# Patient Record
Sex: Female | Born: 1970 | Race: White | Hispanic: No | Marital: Single | Smoking: Never smoker
Health system: Southern US, Community
[De-identification: ages and names within clinical notes are randomized; demographics above are authoritative.]

## PROBLEM LIST (undated history)

## (undated) DIAGNOSIS — F329 Major depressive disorder, single episode, unspecified: Secondary | ICD-10-CM

## (undated) DIAGNOSIS — C259 Malignant neoplasm of pancreas, unspecified: Secondary | ICD-10-CM

## (undated) DIAGNOSIS — A6000 Herpesviral infection of urogenital system, unspecified: Secondary | ICD-10-CM

## (undated) DIAGNOSIS — L811 Chloasma: Secondary | ICD-10-CM

## (undated) DIAGNOSIS — F909 Attention-deficit hyperactivity disorder, unspecified type: Secondary | ICD-10-CM

## (undated) DIAGNOSIS — K219 Gastro-esophageal reflux disease without esophagitis: Secondary | ICD-10-CM

## (undated) DIAGNOSIS — F419 Anxiety disorder, unspecified: Secondary | ICD-10-CM

## (undated) DIAGNOSIS — F32A Depression, unspecified: Secondary | ICD-10-CM

## (undated) DIAGNOSIS — G4700 Insomnia, unspecified: Secondary | ICD-10-CM

## (undated) HISTORY — DX: Major depressive disorder, single episode, unspecified: F32.9

## (undated) HISTORY — DX: Insomnia, unspecified: G47.00

## (undated) HISTORY — DX: Attention-deficit hyperactivity disorder, unspecified type: F90.9

## (undated) HISTORY — DX: Herpesviral infection of urogenital system, unspecified: A60.00

## (undated) HISTORY — DX: Depression, unspecified: F32.A

## (undated) HISTORY — DX: Malignant neoplasm of pancreas, unspecified: C25.9

## (undated) HISTORY — DX: Gastro-esophageal reflux disease without esophagitis: K21.9

## (undated) HISTORY — DX: Anxiety disorder, unspecified: F41.9

## (undated) HISTORY — DX: Chloasma: L81.1

---

## 1996-08-30 HISTORY — PX: TONSILLECTOMY: SUR1361

## 1999-08-28 ENCOUNTER — Ambulatory Visit (HOSPITAL_COMMUNITY): Admission: AD | Admit: 1999-08-28 | Discharge: 1999-08-28 | Payer: Self-pay | Admitting: Obstetrics and Gynecology

## 1999-11-16 ENCOUNTER — Inpatient Hospital Stay (HOSPITAL_COMMUNITY): Admission: AD | Admit: 1999-11-16 | Discharge: 1999-11-19 | Payer: Self-pay | Admitting: Obstetrics and Gynecology

## 2000-03-28 ENCOUNTER — Other Ambulatory Visit: Admission: RE | Admit: 2000-03-28 | Discharge: 2000-03-28 | Payer: Self-pay | Admitting: Obstetrics and Gynecology

## 2001-01-09 ENCOUNTER — Ambulatory Visit (HOSPITAL_COMMUNITY): Admission: RE | Admit: 2001-01-09 | Discharge: 2001-01-09 | Payer: Self-pay | Admitting: Obstetrics and Gynecology

## 2001-03-15 ENCOUNTER — Inpatient Hospital Stay (HOSPITAL_COMMUNITY): Admission: AD | Admit: 2001-03-15 | Discharge: 2001-03-18 | Payer: Self-pay | Admitting: Obstetrics and Gynecology

## 2001-04-13 ENCOUNTER — Other Ambulatory Visit: Admission: RE | Admit: 2001-04-13 | Discharge: 2001-04-13 | Payer: Self-pay | Admitting: Obstetrics and Gynecology

## 2001-10-02 ENCOUNTER — Other Ambulatory Visit: Admission: RE | Admit: 2001-10-02 | Discharge: 2001-10-02 | Payer: Self-pay | Admitting: Obstetrics and Gynecology

## 2003-01-30 ENCOUNTER — Other Ambulatory Visit: Admission: RE | Admit: 2003-01-30 | Discharge: 2003-01-30 | Payer: Self-pay | Admitting: Obstetrics and Gynecology

## 2004-04-02 ENCOUNTER — Other Ambulatory Visit: Admission: RE | Admit: 2004-04-02 | Discharge: 2004-04-02 | Payer: Self-pay | Admitting: Obstetrics and Gynecology

## 2005-10-07 ENCOUNTER — Other Ambulatory Visit: Admission: RE | Admit: 2005-10-07 | Discharge: 2005-10-07 | Payer: Self-pay | Admitting: Obstetrics and Gynecology

## 2011-12-23 ENCOUNTER — Ambulatory Visit (INDEPENDENT_AMBULATORY_CARE_PROVIDER_SITE_OTHER): Payer: BC Managed Care – PPO | Admitting: Physician Assistant

## 2011-12-23 ENCOUNTER — Encounter: Payer: Self-pay | Admitting: Physician Assistant

## 2011-12-23 VITALS — BP 108/63 | HR 86 | Temp 98.3°F | Resp 16 | Ht 61.0 in | Wt 124.0 lb

## 2011-12-23 DIAGNOSIS — F411 Generalized anxiety disorder: Secondary | ICD-10-CM

## 2011-12-23 DIAGNOSIS — G4700 Insomnia, unspecified: Secondary | ICD-10-CM

## 2011-12-23 DIAGNOSIS — K219 Gastro-esophageal reflux disease without esophagitis: Secondary | ICD-10-CM

## 2011-12-23 DIAGNOSIS — F419 Anxiety disorder, unspecified: Secondary | ICD-10-CM

## 2011-12-23 DIAGNOSIS — Z0000 Encounter for general adult medical examination without abnormal findings: Secondary | ICD-10-CM

## 2011-12-23 LAB — POCT URINALYSIS DIPSTICK
Ketones, UA: 15
Leukocytes, UA: NEGATIVE
Nitrite, UA: NEGATIVE
Protein, UA: NEGATIVE
Urobilinogen, UA: 0.2

## 2011-12-23 LAB — CBC WITH DIFFERENTIAL/PLATELET
Basophils Absolute: 0 10*3/uL (ref 0.0–0.1)
Basophils Relative: 0 % (ref 0–1)
Eosinophils Relative: 1 % (ref 0–5)
HCT: 41.3 % (ref 36.0–46.0)
MCHC: 34.9 g/dL (ref 30.0–36.0)
MCV: 88.8 fL (ref 78.0–100.0)
Monocytes Absolute: 0.3 10*3/uL (ref 0.1–1.0)
Monocytes Relative: 4 % (ref 3–12)
RDW: 11.8 % (ref 11.5–15.5)

## 2011-12-23 LAB — POCT UA - MICROSCOPIC ONLY
Crystals, Ur, HPF, POC: NEGATIVE
RBC, urine, microscopic: NEGATIVE

## 2011-12-23 MED ORDER — ESZOPICLONE 2 MG PO TABS: 2.0000 mg | ORAL_TABLET | Freq: Every day | ORAL | Status: DC

## 2011-12-23 MED ORDER — SERTRALINE HCL 50 MG PO TABS: 50.0000 mg | ORAL_TABLET | Freq: Every day | ORAL | Status: DC

## 2011-12-23 MED ORDER — OMEPRAZOLE 40 MG PO CPDR: 40.0000 mg | DELAYED_RELEASE_CAPSULE | Freq: Every day | ORAL | Status: DC

## 2011-12-23 NOTE — Progress Notes (Signed)
Called in Rx for [facility]esta to [facility] that printed off at [facility].
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
## 2011-12-23 NOTE — Patient Instructions (Signed)
Please take your sertraline (zoloft) take 1/2 tablet daily for 1 week then increase to 1 tablet daily. We will call you with the results of your labs.  Anxiety and Panic Attacks Your caregiver has informed you that you are having an anxiety or panic attack. There may be many forms of this. Most of the time these attacks come suddenly and without warning. They come at any time of day, including periods of sleep, and at any time of life. They may be strong and unexplained. Although panic attacks are very scary, they are physically harmless. Sometimes the cause of your anxiety is not known. Anxiety is a protective mechanism of the body in its fight or flight mechanism. Most of these perceived danger situations are actually nonphysical situations (such as anxiety over losing a job). CAUSES  The causes of an anxiety or panic attack are many. Panic attacks may occur in otherwise healthy people given a certain set of circumstances. There may be a genetic cause for panic attacks. Some medications may also have anxiety as a side effect. SYMPTOMS  Some of the most common feelings are:  Intense terror.   Dizziness, feeling faint.   Hot and cold flashes.   Fear of going crazy.   Feelings that nothing is real.   Sweating.   Shaking.   Chest pain or a fast heartbeat (palpitations).   Smothering, choking sensations.   Feelings of impending doom and that death is near.   Tingling of extremities, this may be from over-breathing.   Altered reality (derealization).   Being detached from yourself (depersonalization).  Several symptoms can be present to make up anxiety or panic attacks. DIAGNOSIS  The evaluation by your caregiver will depend on the type of symptoms you are experiencing. The diagnosis of anxiety or panic attack is made when no physical illness can be determined to be a cause of the symptoms. TREATMENT  Treatment to prevent anxiety and panic attacks may include:  Avoidance of  circumstances that cause anxiety.   Reassurance and relaxation.   Regular exercise.   Relaxation therapies, such as yoga.   Psychotherapy with a psychiatrist or therapist.   Avoidance of caffeine, alcohol and illegal drugs.   Prescribed medication.  SEEK IMMEDIATE MEDICAL CARE IF:   You experience panic attack symptoms that are different than your usual symptoms.   You have any worsening or concerning symptoms.  Document Released: 08/16/2005 Document Revised: 08/05/2011 Document Reviewed: 12/18/2009 [facility] Patient Information 2012 [place], [place]. Insomnia Insomnia is frequent trouble falling and/or staying asleep. Insomnia can be a long term problem or a short term problem. Both are common. Insomnia can be a short term problem when the wakefulness is related to a certain stress or worry. Long term insomnia is often related to ongoing stress during waking hours and/or poor sleeping habits. Overtime, sleep deprivation itself can make the problem worse. Every little thing feels more severe because you are overtired and your ability to cope is decreased. CAUSES   Stress, anxiety, and depression.   Poor sleeping habits.   Distractions such as TV in the bedroom.   Naps close to bedtime.   Engaging in emotionally charged conversations before bed.   Technical reading before sleep.   Alcohol and other sedatives. They may make the problem worse. They can hurt normal sleep patterns and normal dream activity.   Stimulants such as caffeine for several hours prior to bedtime.   Pain syndromes and shortness of breath can cause insomnia.   Exercise  late at night.   Changing time zones may cause sleeping problems (jet lag).  It is sometimes helpful to have someone observe your sleeping patterns. They should look for periods of not breathing during the night (sleep apnea). They should also look to see how long those periods last. If you live alone or observers are uncertain, you can  also be observed at a sleep clinic where your sleep patterns will be professionally monitored. Sleep apnea requires a checkup and treatment. Give your caregivers your medical history. Give your caregivers observations your family has made about your sleep.  SYMPTOMS   Not feeling rested in the morning.   Anxiety and restlessness at bedtime.   Difficulty falling and staying asleep.  TREATMENT   Your caregiver may prescribe treatment for an underlying medical disorders. Your caregiver can give advice or help if you are using alcohol or other drugs for self-medication. Treatment of underlying problems will usually eliminate insomnia problems.   Medications can be prescribed for short time use. They are generally not recommended for lengthy use.   Over-the-counter sleep medicines are not recommended for lengthy use. They can be habit forming.   You can promote easier sleeping by making lifestyle changes such as:   Using relaxation techniques that help with breathing and reduce muscle tension.   Exercising earlier in the day.   Changing your diet and the time of your last meal. No night time snacks.   Establish a regular time to go to bed.   Counseling can help with stressful problems and worry.   Soothing music and white noise may be helpful if there are background noises you cannot remove.   Stop tedious detailed work at least one hour before bedtime.  HOME CARE INSTRUCTIONS   Keep a diary. Inform your caregiver about your progress. This includes any medication side effects. See your caregiver regularly. Take note of:   Times when you are asleep.   Times when you are awake during the night.   The quality of your sleep.   How you feel the next day.  This information will help your caregiver care for you.  Get out of bed if you are still awake after 15 minutes. Read or do some quiet activity. Keep the lights down. Wait until you feel sleepy and go back to bed.   Keep regular  sleeping and waking hours. Avoid naps.   Exercise regularly.   Avoid distractions at bedtime. Distractions include watching television or engaging in any intense or detailed activity like attempting to balance the household checkbook.   Develop a bedtime ritual. Keep a familiar routine of bathing, brushing your teeth, climbing into bed at the same time each night, listening to soothing music. Routines increase the success of falling to sleep faster.   Use relaxation techniques. This can be using breathing and muscle tension release routines. It can also include visualizing peaceful scenes. You can also help control troubling or intruding thoughts by keeping your mind occupied with boring or repetitive thoughts like the old concept of counting sheep. You can make it more creative like imagining planting one beautiful flower after another in your backyard garden.   During your day, work to eliminate stress. When this is not possible use some of the previous suggestions to help reduce the anxiety that accompanies stressful situations.  MAKE SURE YOU:   Understand these instructions.   Will watch your condition.   Will get help right away if you are not doing well  or get worse.  Document Released: 08/13/2000 Document Revised: 08/05/2011 Document Reviewed: 09/13/2007 [facility] Patient Information 2012 [place], [place].

## 2011-12-23 NOTE — Progress Notes (Signed)
I have examined this patient along with the student and agree.  

## 2011-12-24 LAB — H. PYLORI ANTIBODY, IGG: H Pylori IgG: 0.45 {ISR}

## 2011-12-24 LAB — LIPID PANEL
HDL: 60 mg/dL (ref >39)
LDL Cholesterol: 66 mg/dL (ref 0–99)
VLDL: 12 mg/dL (ref 0–40)

## 2011-12-24 LAB — COMPREHENSIVE METABOLIC PANEL
ALT: 19 U/L (ref 0–35)
AST: 22 U/L (ref 0–37)
Alkaline Phosphatase: 41 U/L (ref 39–117)
CO2: 28 mEq/L (ref 19–32)
Sodium: 138 mEq/L (ref 135–145)
Total Bilirubin: 0.4 mg/dL (ref 0.3–1.2)
Total Protein: 6.9 g/dL (ref 6.0–8.3)

## 2012-01-20 ENCOUNTER — Encounter: Payer: Self-pay | Admitting: Physician Assistant

## 2012-01-20 ENCOUNTER — Ambulatory Visit (INDEPENDENT_AMBULATORY_CARE_PROVIDER_SITE_OTHER): Payer: BC Managed Care – PPO | Admitting: Physician Assistant

## 2012-01-20 VITALS — BP 113/66 | HR 74 | Temp 98.1°F | Resp 16 | Ht 61.5 in | Wt 122.6 lb

## 2012-01-20 DIAGNOSIS — G4700 Insomnia, unspecified: Secondary | ICD-10-CM

## 2012-01-20 DIAGNOSIS — K2100 Gastro-esophageal reflux disease with esophagitis, without bleeding: Secondary | ICD-10-CM

## 2012-01-20 DIAGNOSIS — F411 Generalized anxiety disorder: Secondary | ICD-10-CM

## 2012-01-20 DIAGNOSIS — F419 Anxiety disorder, unspecified: Secondary | ICD-10-CM

## 2012-01-20 MED ORDER — SERTRALINE HCL 50 MG PO TABS: 100.0000 mg | ORAL_TABLET | Freq: Every day | ORAL | Status: DC

## 2012-01-20 MED ORDER — ESZOPICLONE 2 MG PO TABS: 2.0000 mg | ORAL_TABLET | Freq: Every day | ORAL | Status: DC

## 2012-01-20 NOTE — Progress Notes (Signed)
  Subjective:    Patient ID: Allison Miller, female    DOB: 01/18/1971, 41 y.o.   MRN: [id number]  HPI Patient presents for 1 month follow up on anxiety and insomnia. States she likes the Zoloft, but she still feels like she is having palpitations at night. She is currently taking her Zoloft and Lunesta together at night and believes that could be causing the palpitations. Also says that the Alfonso Patten is not working as well as it did before and she finds that she is not sleeping through the night and is having to use it more nights than she has before.   Socially, she is working on setting boundaries and trying to manage her stress this way. Still planning on going to counseling - wants to try family counseling to include her 2 sons.   Acid reflux is under control with prilosec 40 mg daily.     Review of Systems  All other systems reviewed and are negative.       Objective:   Physical Exam  Constitutional: She is oriented to person, place, and time. She appears well-developed and well-nourished.  HENT:  Head: Normocephalic and atraumatic.  Right Ear: External ear normal.  Left Ear: External ear normal.  Nose: Nose normal.  Mouth/Throat: Oropharynx is clear and moist. No oropharyngeal exudate.  Eyes: Conjunctivae are normal.  Neck: Neck supple.  Cardiovascular: Normal rate, regular rhythm and normal heart sounds.   Pulmonary/Chest: Effort normal and breath sounds normal.  Lymphadenopathy:    She has no cervical adenopathy.  Neurological: She is alert and oriented to person, place, and time.  Psychiatric: She has a normal mood and affect. Her behavior is normal. Judgment and thought content normal.          Assessment & Plan:   1. Anxiety  Will first try taking Zoloft in a.m and Lunesta at night to see if palpitations improve and sleeping returns to normal. If no improvement, will increase dose of Lunesta to 3 mg qhs. If still not at goal, increase Zoloft to 100 mg daily  sertraline (ZOLOFT) 50 MG tablet  2. Insomnia  eszopiclone (LUNESTA) 2 MG TABS  3. Reflux esophagitis - controlled Continue current treatment plan

## 2012-01-24 NOTE — Progress Notes (Signed)
Precepted with Ms. Marte, PA-C and agree.  

## 2012-02-29 ENCOUNTER — Other Ambulatory Visit: Payer: Self-pay | Admitting: Family Medicine

## 2012-02-29 DIAGNOSIS — G4700 Insomnia, unspecified: Secondary | ICD-10-CM

## 2012-02-29 MED ORDER — ESZOPICLONE 2 MG PO TABS: 2.0000 mg | ORAL_TABLET | Freq: Every day | ORAL | Status: DC

## 2012-03-01 ENCOUNTER — Telehealth: Payer: Self-pay

## 2012-03-01 NOTE — Telephone Encounter (Signed)
lmom to call back 

## 2012-03-01 NOTE — Telephone Encounter (Signed)
Pt had CB and LM on nurse VM. Tried to call her back and had to [facility]. Ask pt what current doses of Lunesta and Zoloft and what time of day she is taking each (see plan at end of [facility] notes from 01/20/12), and details of Sxs.

## 2012-03-01 NOTE — Telephone Encounter (Signed)
Pt states that since she has been on the lunesta and zoloft she has had little sleep and has only had bowl movements once very three days. Please advise. [phone]

## 2012-03-02 NOTE — Telephone Encounter (Signed)
Pt reports that she is still taking the 50 mg of Zoloft in the morning because she is already having so much trouble w/constipation she didn't want to make it any worse. The Alfonso Patten is not really helping her sleep. She tried increasing it to 3 mg which did help her get to sleep a little better than the 2 mg, but she still couldn't stay asleep. She has too much trouble cutting the tablets in half w/out crushing them, so she has only been taking the one tab and it just isn't helping. Pt started using Miralax about 4 days ago w/no relief until today when she doubled the dose and took it w/prune juice which helped her move her bowels, but not completely. When she increased her fiber/fresh fruits and vegs, she just gets so bloated and uncomfortable she can't stand it. She drinks a lot of water and gets exercise daily. Please advise.

## 2012-03-06 NOTE — Telephone Encounter (Signed)
1. Constipation:  She can increase the Miralax to 3 times daily if needed to keep her moving her bowels.  If she thinks it's directly related to the sertraline (Zoloft), we can d/c it and try another product (I know she has tried celexa, but has she tried Effexor?).  2. Insomnia:  Of course, the plan was to increase the sertraline dose, but that may not be an option.  If she hasn't tried Ambien CR, we could try that.  If so, what about clonazepam?  Or Trazodone?

## 2012-03-07 NOTE — Telephone Encounter (Signed)
LMOM to CB. 

## 2012-03-08 MED ORDER — ZOLPIDEM TARTRATE ER 6.25 MG PO TBCR: 6.2500 mg | EXTENDED_RELEASE_TABLET | Freq: Every evening | ORAL | Status: DC | PRN

## 2012-03-08 MED ORDER — VENLAFAXINE HCL ER 37.5 MG PO CP24: 37.5000 mg | ORAL_CAPSULE | Freq: Every day | ORAL | Status: DC

## 2012-03-08 NOTE — Telephone Encounter (Signed)
Called in Ambien [facility] to [facility] and explained new meds and f/up plan to pt. Pt agreed to try new meds and f/up as instr'd.

## 2012-03-08 NOTE — Telephone Encounter (Signed)
LMOM to CB. 

## 2012-03-08 NOTE — Telephone Encounter (Signed)
Let's try the Effexor [facility] [address] [facility]), starting at 37.6 mg and then increasing to 2 PO QD after 1-2 weeks if she's tolerating it.  We'll also try the Ambien CR 6.25 mg (printed, please fax or call in), 1-2 PO QHS prn.  Plan to re-evaluate in 4-6 weeks, sooner if this is ineffective.

## 2012-03-08 NOTE — Telephone Encounter (Signed)
Pt CB and stated that she actually stopped the Zoloft about 4 days ago because she was so miserable and she hasn't had many SEs from stopping abruptly - only today starting just a little dizzy. With the help of laxatives, she seems to have her constipation under better control, but does think the Zoloft was responsible. Pt does not want to be on a med that might cause the same or even worse SEs, but would be willing to try something else if Chelle suggests it. She has not tried Effexor, or anything other than Celexa. Pt states she thinks she may have tried a couple of samples of Ambien several years ago, and doesn't think it helped but not sure and would try again, or the clonazepam or trazodone which she has not tried.

## 2012-03-22 ENCOUNTER — Ambulatory Visit (INDEPENDENT_AMBULATORY_CARE_PROVIDER_SITE_OTHER): Payer: BC Managed Care – PPO | Admitting: Physician Assistant

## 2012-03-22 ENCOUNTER — Other Ambulatory Visit: Payer: Self-pay | Admitting: Physician Assistant

## 2012-03-22 ENCOUNTER — Telehealth: Payer: Self-pay

## 2012-03-22 VITALS — BP 114/72 | HR 71 | Temp 98.4°F | Resp 18 | Ht 62.0 in | Wt 128.0 lb

## 2012-03-22 DIAGNOSIS — F419 Anxiety disorder, unspecified: Secondary | ICD-10-CM

## 2012-03-22 DIAGNOSIS — F411 Generalized anxiety disorder: Secondary | ICD-10-CM

## 2012-03-22 DIAGNOSIS — R42 Dizziness and giddiness: Secondary | ICD-10-CM

## 2012-03-22 MED ORDER — ALPRAZOLAM 0.25 MG PO TABS: 0.2500 mg | ORAL_TABLET | Freq: Two times a day (BID) | ORAL | Status: AC | PRN

## 2012-03-22 MED ORDER — MECLIZINE HCL 32 MG PO TABS: 32.0000 mg | ORAL_TABLET | Freq: Three times a day (TID) | ORAL | Status: DC | PRN

## 2012-03-22 MED ORDER — MECLIZINE HCL 25 MG PO TABS: 25.0000 mg | ORAL_TABLET | Freq: Three times a day (TID) | ORAL | Status: AC | PRN

## 2012-03-22 NOTE — Telephone Encounter (Signed)
rx sent in for right dosage and pt notified

## 2012-03-22 NOTE — Progress Notes (Signed)
  Subjective:    Patient ID: Allison Miller, female    DOB: Jun 23, 1971, 41 y.o.   MRN: [id number]  HPI Patient presents for recheck of anxiety. About 10 days ago she switched from Zoloft to Effexor due to side effects.  Took Effexor for about 10 days but says the side effects were even worse with Effexor than Zoloft. Complains of constipation, fatigue, and "just not feeling herself."  Says that major stressors in her life (namely her ex-husband) has been less of a stress.  She has set boundaries and has a new friend who has been a strong person to lean on. She is interested in trying to come off of the medication.   She is here today because she d/c'ed the Effexor 4 days ago and has been experiencing vertigo, dizziness, and "swimmy feeling." Says she had similar symptoms after she came off of Celexa several years ago.  Has had 2 episodes of emesis. No vision changes, headache, SOB, or chest pain.      Review of Systems  All other systems reviewed and are negative.       Objective:   Physical Exam  Constitutional: She is oriented to person, place, and time. She appears well-developed and well-nourished.  HENT:  Head: Normocephalic and atraumatic.  Right Ear: External ear normal.  Left Ear: External ear normal.  Eyes: Conjunctivae are normal.  Neck: Normal range of motion.  Cardiovascular: Regular rhythm.   Pulmonary/Chest: Effort normal.  Musculoskeletal: Normal range of motion.  Neurological: She is alert and oriented to person, place, and time.  Psychiatric: She has a normal mood and affect. Her behavior is normal. Judgment and thought content normal.          Assessment & Plan:   1. Vertigo  Will treat with Meclizine tid as needed for vertigo. Follow up with any worsening symptoms, or if they fail to improve. meclizine (ANTIVERT) 32 MG tablet  2. Anxiety  Effexor discontinued.  Xanax 0.25 mg written to take as needed for anxiety. Trial of no daily medicine. Patient  instructed to call or follow up if she finds herself needing to use Xanax daily, or worsening symptoms.   ALPRAZolam (XANAX) 0.25 MG tablet

## 2012-03-22 NOTE — Telephone Encounter (Signed)
Pt states that she was in earlier and was prescribed something for vertigo but the pharmacy states that something is wrong with the dosage, pt would like [facility] [address] to correct this asap. [phone]

## 2012-03-31 ENCOUNTER — Encounter: Payer: Self-pay | Admitting: Physician Assistant

## 2012-04-27 ENCOUNTER — Ambulatory Visit: Payer: BC Managed Care – PPO | Admitting: Physician Assistant

## 2012-05-22 ENCOUNTER — Telehealth: Payer: Self-pay

## 2012-05-22 NOTE — Telephone Encounter (Signed)
Please get more details.

## 2012-05-22 NOTE — Telephone Encounter (Signed)
PT WOULD LIKE TO KNOW IF SHE SHOULD RESUME TAKING HER CELE XA . PLEASE CALL [phone]

## 2012-05-24 NOTE — Telephone Encounter (Signed)
She was seen in July and stopped Effexor due to nausea, now is asking if she can try Celexa, she took this for several years and states she did well. Please advise.

## 2012-05-24 NOTE — Telephone Encounter (Signed)
Allison Miller called again, the two previous anxiety medications gave her stomach issues.  When last seen, provider advised that if needed, she could try Celexa which had worked well for her in the past.  Myrella's previous dosage had been 40 mg daily.  [phone]

## 2012-05-25 MED ORDER — CITALOPRAM HYDROBROMIDE 20 MG PO TABS: 20.0000 mg | ORAL_TABLET | Freq: Every day | ORAL | Status: DC

## 2012-05-25 NOTE — Telephone Encounter (Signed)
Start Celexa 20 mg daily and then will need a recheck in 4-6 weeks. Rx sent to pharmacy

## 2012-05-26 NOTE — Telephone Encounter (Signed)
LMOM notifying pt of Rx and f/up instr's. Asked for CB w/any further ?s.

## 2012-06-28 ENCOUNTER — Other Ambulatory Visit: Payer: Self-pay | Admitting: Physician Assistant

## 2012-08-01 ENCOUNTER — Telehealth: Payer: Self-pay

## 2012-08-01 MED ORDER — CITALOPRAM HYDROBROMIDE 20 MG PO TABS: 20.0000 mg | ORAL_TABLET | Freq: Every day | ORAL | Status: DC

## 2012-08-01 NOTE — Telephone Encounter (Signed)
Pt has made an appt with Chelle for 12/19, needs one more month of Celexa please. [facility] [phone]

## 2012-08-01 NOTE — Telephone Encounter (Signed)
Sent to pharmacy 

## 2012-08-07 NOTE — Telephone Encounter (Signed)
Encounter still open, not sure if pt notified to p/u RX

## 2012-08-07 NOTE — Telephone Encounter (Signed)
Thanks, yes, patient has gotten it.

## 2012-08-17 ENCOUNTER — Encounter: Payer: Self-pay | Admitting: Physician Assistant

## 2012-08-17 ENCOUNTER — Ambulatory Visit (INDEPENDENT_AMBULATORY_CARE_PROVIDER_SITE_OTHER): Payer: BC Managed Care – PPO | Admitting: Physician Assistant

## 2012-08-17 VITALS — BP 116/68 | HR 76 | Temp 98.0°F | Resp 16 | Ht 62.0 in | Wt 122.0 lb

## 2012-08-17 DIAGNOSIS — F419 Anxiety disorder, unspecified: Secondary | ICD-10-CM | POA: Insufficient documentation

## 2012-08-17 DIAGNOSIS — G4700 Insomnia, unspecified: Secondary | ICD-10-CM | POA: Insufficient documentation

## 2012-08-17 DIAGNOSIS — K219 Gastro-esophageal reflux disease without esophagitis: Secondary | ICD-10-CM

## 2012-08-17 DIAGNOSIS — A6000 Herpesviral infection of urogenital system, unspecified: Secondary | ICD-10-CM

## 2012-08-17 DIAGNOSIS — F411 Generalized anxiety disorder: Secondary | ICD-10-CM

## 2012-08-17 MED ORDER — ALPRAZOLAM 0.25 MG PO TABS: 0.2500 mg | ORAL_TABLET | Freq: Every evening | ORAL | Status: DC | PRN

## 2012-08-17 MED ORDER — VALACYCLOVIR HCL 500 MG PO TABS: 500.0000 mg | ORAL_TABLET | Freq: Two times a day (BID) | ORAL | Status: DC

## 2012-08-17 MED ORDER — CITALOPRAM HYDROBROMIDE 40 MG PO TABS: 40.0000 mg | ORAL_TABLET | Freq: Every day | ORAL | Status: DC

## 2012-08-17 MED ORDER — OMEPRAZOLE 40 MG PO CPDR: 40.0000 mg | DELAYED_RELEASE_CAPSULE | Freq: Every day | ORAL | Status: DC

## 2012-08-17 NOTE — Progress Notes (Signed)
Subjective:    Patient ID: Allison Miller, female    DOB: 07-10-1971, 41 y.o.   MRN: [id number]  HPI This 41 y.o. female presents for evaluation of insomnia and anxiety. Is doing much better on citalopram 20 mg than on Effexor or Zoloft. "I should have gone back to celexa a long time ago."  Her ex-husband was taking her to court for increased custody of their sons, which was really escalating her symptoms.  They were in mediation, but have decided mutually that they would stop, as their boys are early teens and are suffering with the tension between their parents.  She feels much better with that perspective, but she's still not sleeping well.  Was previously on Celexa 40 mg. Wants to increase to that again. No thoughts of self or other harm.  She also requests refills of valtrex and omeprazole, which are typically filled by her GYN.  She has an appointment scheduled there, but she will run out prior to it.  Past Medical History  Diagnosis Date  . Anxiety   . Insomnia     No past surgical history on file.  Prior to Admission medications   Medication Sig Start Date End Date Taking? Authorizing Provider  ALPRAZolam (XANAX) 0.25 MG tablet Take 1 tablet (0.25 mg total) by mouth at bedtime as needed for sleep or anxiety. 08/17/12  Yes Damarko Stitely S [place] Vandergriff, PA-C  citalopram (CELEXA) 20 MG tablet Take 1 tablet (20 mg total) by mouth daily.   Yes Jethro Radke S Robert Sunga, PA-C  loratadine (CLARITIN) 10 MG tablet Take 10 mg by mouth daily.   Yes Historical Provider, MD  Multiple Vitamin (MULTIVITAMIN) tablet Take 1 tablet by mouth daily.   Yes Historical Provider, MD  omeprazole (PRILOSEC) 40 MG capsule Take 1 capsule (40 mg total) by mouth daily. 08/17/12 08/17/13 Yes Arleny Kruger S Glori Machnik, PA-C  phentermine 37.5 MG capsule Take 37.5 mg by mouth every morning.   Yes Historical Provider, MD  valACYclovir (VALTREX) 500 MG tablet Take 1 tablet (500 mg total) by mouth 2 (two) times daily. 08/17/12  Yes Delayla Hoffmaster S  Danisha Brassfield, PA-C  zolpidem (AMBIEN CR) 6.25 MG CR tablet Take 1-2 tablets (6.25-12.5 mg total) by mouth at bedtime as needed for sleep. 03/08/12 04/07/12  Braylynn Lewing S Selby Foisy, PA-C    No Known Allergies   No family history on file.  Review of Systems As above.    Objective:   Physical Exam  Constitutional: She is oriented to person, place, and time. Vital signs are normal. She appears well-developed and well-nourished. No distress.  HENT:  Head: Normocephalic and atraumatic.  Right Ear: Hearing normal.  Left Ear: Hearing normal.  Eyes: EOM are normal. Pupils are equal, round, and reactive to light.  Neck: Normal range of motion. Neck supple. No thyromegaly present.  Cardiovascular: Normal rate, regular rhythm and normal heart sounds.   Pulses:      Radial pulses are 2+ on the right side, and 2+ on the left side.  Pulmonary/Chest: Effort normal and breath sounds normal.  Lymphadenopathy:       Head (right side): No tonsillar, no preauricular, no posterior auricular and no occipital adenopathy present.       Head (left side): No tonsillar, no preauricular, no posterior auricular and no occipital adenopathy present.    She has no cervical adenopathy.       Right: No supraclavicular adenopathy present.       Left: No supraclavicular adenopathy present.  Neurological: She is alert  and oriented to person, place, and time. No sensory deficit.  Skin: Skin is warm, dry and intact. No rash noted. No cyanosis or erythema. Nails show no clubbing.  Psychiatric: She has a normal mood and affect.       Assessment & Plan:   1. Insomnia  ALPRAZolam (XANAX) 0.25 MG tablet  2. Anxiety  Increase citalopram (CELEXA) 40 MG tablet, ALPRAZolam (XANAX) 0.25 MG tablet  3. Genital HSV  valACYclovir (VALTREX) 500 MG tablet  4. Esophageal reflux  omeprazole (PRILOSEC) 40 MG capsule

## 2012-11-21 ENCOUNTER — Other Ambulatory Visit: Payer: Self-pay | Admitting: Radiology

## 2012-11-21 DIAGNOSIS — F419 Anxiety disorder, unspecified: Secondary | ICD-10-CM

## 2012-11-21 DIAGNOSIS — G4700 Insomnia, unspecified: Secondary | ICD-10-CM

## 2012-11-21 NOTE — Telephone Encounter (Signed)
Please advise on fax received from [facility] for patients Alprazolam. pended

## 2012-11-23 ENCOUNTER — Telehealth: Payer: Self-pay

## 2012-11-23 NOTE — Telephone Encounter (Signed)
Pharm requests RF of alprazolam 0.25 mg.

## 2012-11-24 MED ORDER — ALPRAZOLAM 0.25 MG PO TABS: 0.2500 mg | ORAL_TABLET | Freq: Every evening | ORAL | Status: DC | PRN

## 2012-11-24 NOTE — Telephone Encounter (Signed)
rx faxed

## 2012-11-24 NOTE — Telephone Encounter (Signed)
rx printed

## 2012-12-26 ENCOUNTER — Telehealth: Payer: Self-pay

## 2012-12-26 DIAGNOSIS — G4700 Insomnia, unspecified: Secondary | ICD-10-CM

## 2012-12-26 DIAGNOSIS — F419 Anxiety disorder, unspecified: Secondary | ICD-10-CM

## 2012-12-26 NOTE — Telephone Encounter (Signed)
Pharm requests RF of alprazolam 0.25 mg. 

## 2012-12-27 ENCOUNTER — Telehealth: Payer: Self-pay

## 2012-12-27 NOTE — Telephone Encounter (Signed)
Pt. Called needs refill on RX Xanax. Please let pt know when done.  [phone] [facility]

## 2012-12-28 MED ORDER — ALPRAZOLAM 0.25 MG PO TABS: 0.2500 mg | ORAL_TABLET | Freq: Every evening | ORAL | Status: DC | PRN

## 2012-12-28 NOTE — Telephone Encounter (Signed)
Called in for her

## 2012-12-28 NOTE — Telephone Encounter (Signed)
This has already been requested from pharmacy/ duplicate.

## 2012-12-28 NOTE — Telephone Encounter (Signed)
Please call in: Meds ordered this encounter  Medications  . ALPRAZolam (XANAX) 0.25 MG tablet    Sig: Take 1 tablet (0.25 mg total) by mouth at bedtime as needed for sleep or anxiety.    Dispense:  30 tablet    Refill:  0    Order Specific Question:  Supervising Provider    Answer:  DOOLITTLE, ROBERT P [[id number]]

## 2013-01-01 ENCOUNTER — Other Ambulatory Visit: Payer: Self-pay | Admitting: Radiology

## 2013-01-15 ENCOUNTER — Telehealth: Payer: Self-pay

## 2013-01-15 NOTE — Telephone Encounter (Signed)
Called her to find out what problems she is having with the Celexa. Left message for her to call me back.

## 2013-01-15 NOTE — Telephone Encounter (Signed)
Patient is wanting to know if she could change her Celexa 40mg  to Wellbutrin.  Please call patient back to discuss if we can change it or if she needs to come in to see Chelle.    Best#: [phone]

## 2013-01-15 NOTE — Telephone Encounter (Signed)
Spoke to patient she is c/o fatigue. She thinks the Celexa may be causing this. 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 Also she c/o lack of sexual desire with the Celexa.

## 2013-01-16 NOTE — Telephone Encounter (Signed)
Please advise the patient to RTC to discuss her fatigue and possible medication change.

## 2013-01-17 NOTE — Telephone Encounter (Signed)
Thank you . I have called her to advise, left message for her to call me back.

## 2013-01-17 NOTE — Telephone Encounter (Signed)
Pt advised.

## 2013-02-08 ENCOUNTER — Encounter: Payer: Self-pay | Admitting: Physician Assistant

## 2013-02-08 ENCOUNTER — Telehealth: Payer: Self-pay

## 2013-02-08 ENCOUNTER — Ambulatory Visit (INDEPENDENT_AMBULATORY_CARE_PROVIDER_SITE_OTHER): Payer: BC Managed Care – PPO | Admitting: Physician Assistant

## 2013-02-08 VITALS — BP 106/76 | HR 70 | Temp 99.1°F | Resp 16 | Ht 62.0 in | Wt 134.0 lb

## 2013-02-08 DIAGNOSIS — R0789 Other chest pain: Secondary | ICD-10-CM

## 2013-02-08 DIAGNOSIS — G4700 Insomnia, unspecified: Secondary | ICD-10-CM

## 2013-02-08 DIAGNOSIS — F411 Generalized anxiety disorder: Secondary | ICD-10-CM

## 2013-02-08 DIAGNOSIS — F419 Anxiety disorder, unspecified: Secondary | ICD-10-CM

## 2013-02-08 MED ORDER — BUPROPION HCL ER (XL) 150 MG PO TB24: 150.0000 mg | ORAL_TABLET | Freq: Every day | ORAL | Status: DC

## 2013-02-08 MED ORDER — ALPRAZOLAM 0.5 MG PO TABS: 0.5000 mg | ORAL_TABLET | Freq: Every evening | ORAL | Status: DC | PRN

## 2013-02-08 NOTE — Telephone Encounter (Signed)
PT SAW CHELLE TODAY AND SAID SHE WAS GOING TO CALL IN A PROBIOTIC VSL-3, BUT SHE FORGOT. SHE WOULD LIKE TO KNOW IF WE CAN GO AHEAD AND CALL THAT IN. [facility] [address] PT'S # [phone]

## 2013-02-08 NOTE — Telephone Encounter (Signed)
Please advise 

## 2013-02-08 NOTE — Telephone Encounter (Signed)
Please clarify with the patient that it's the "pack" that she wants, rather than the caps; and how often does she take it?

## 2013-02-08 NOTE — Progress Notes (Signed)
  Subjective:    Patient ID: Allison Miller, female    DOB: [birth year], 42 y.o.   MRN: [id number]  HPI This 42 y.o. female presents for evaluation of chronic insomnia and GERD as well as some new symptoms.  About 6 weeks ago started a health and wellness program, and wants me to review the ingredients of the products.  Has had weight gain instead of weight loss.  Sleeping really well, but tired "all day long." No sex drive at all.  Vaginal dryness. Alprazolam has helped, and takes once a week, if that.   24-day challenge-herbal cleanse for first 10 days-was constipated.  Then felt very good on the supplements x 4 weeks.  Then started having chest pains-heaviness, hard to breathe.  Stopped all the products about 10 days ago, and found the heaviness resolved, but still feels like she can't get a good deep breath.  Feels really tired.  Wants to restart the products, but is concerned the products caused the symptoms, or interacted with the Celexa.  A friend has switched from Celexa to Wellbutrin successfully, and is interested in that.  Review of Systems As above. No HA, dizziness, vision change, N/V, diarrhea, constipation, dysuria, urinary urgency or frequency, myalgias, arthralgias or rash.     Objective:   Physical Exam Blood pressure 106/76, pulse 70, temperature 99.1 F (37.3 C), temperature source Oral, resp. rate 16, height 5\' 2"  (1.575 m), weight 134 lb (60.782 kg), SpO2 97.00%. Body mass index is 24.5 kg/(m^2). Well-developed, well nourished WF who is awake, alert and oriented, in NAD. HEENT: [place]/AT, sclera and conjunctiva are clear.   Neck: supple, non-tender, no lymphadenopathy, thyromegaly. Heart: RRR, no murmur Lungs: normal effort, CTA Extremities: no cyanosis, clubbing or edema. Skin: warm and dry without rash. Psychologic: good mood and appropriate affect, normal speech and behavior.  The many supplements in the program are reviewed.  Multiple products contain stimulants  (caffeine, green coffee and guarana).      Assessment & Plan:  Insomnia - Plan: ALPRAZolam (XANAX) 0.5 MG tablet  Anxiety - Plan: buPROPion (WELLBUTRIN XL) 150 MG 24 hr tablet, ALPRAZolam (XANAX) 0.5 MG tablet  Chest pressure - likely secondary to increased stimulants and exacerbated anxiety.    Patient Instructions  Read the product labels on the supplements and use caution when taking several that contain caffeine, guarana, coffee, and other stimulants. Consider avoiding other caffeine-containing products on days that you take the supplements.  Follow-up at previously scheduled appointment in July.  Fernande Bras, PA-C Physician Assistant-Certified [facility]

## 2013-02-08 NOTE — Patient Instructions (Signed)
Read the product labels on the supplements and use caution when taking several that contain caffeine, guarana, coffee, and other stimulants. Consider avoiding other caffeine-containing products on days that you take the supplements.

## 2013-02-11 NOTE — Telephone Encounter (Signed)
Patient takes 1 capsule 2 times a day.

## 2013-02-12 MED ORDER — VSL#3 PO CAPS: 1.0000 | ORAL_CAPSULE | Freq: Two times a day (BID) | ORAL | Status: DC

## 2013-02-12 NOTE — Telephone Encounter (Signed)
Please advise the patient that the Probiotic VSL #3 [facility] [address]

## 2013-02-13 NOTE — Telephone Encounter (Signed)
Left message that rx was sent in to pharmacy, to call back if any questions.

## 2013-03-01 ENCOUNTER — Encounter: Payer: BC Managed Care – PPO | Admitting: Physician Assistant

## 2013-04-11 ENCOUNTER — Telehealth: Payer: Self-pay

## 2013-04-11 NOTE — Telephone Encounter (Signed)
Patient has questions about her Wellbutrin and Celexa. States that she is not seeing any improvement. [phone]

## 2013-04-11 NOTE — Telephone Encounter (Signed)
She should follow up , was due in July called her to advise and she was transferred to make an appt, she wants to know if you have any advise for her before the appt. She is c/o anxiety

## 2013-04-11 NOTE — Telephone Encounter (Signed)
She can increase the Wellbutrin XL 150 mg to 300 mg by taking 2 daily (take them together). She needs to follow-up with me in 4-6 weeks.

## 2013-04-12 ENCOUNTER — Ambulatory Visit: Payer: BC Managed Care – PPO | Admitting: Physician Assistant

## 2013-04-12 NOTE — Telephone Encounter (Signed)
Patient advised.

## 2013-04-12 NOTE — Telephone Encounter (Signed)
Called her to advise. Left message for her to call me back.  

## 2013-04-13 ENCOUNTER — Telehealth: Payer: Self-pay

## 2013-04-13 DIAGNOSIS — G4700 Insomnia, unspecified: Secondary | ICD-10-CM

## 2013-04-13 DIAGNOSIS — F419 Anxiety disorder, unspecified: Secondary | ICD-10-CM

## 2013-04-13 NOTE — Telephone Encounter (Signed)
Pharm reqs RF of alprazolam 0.5 mg tabs

## 2013-04-18 MED ORDER — ALPRAZOLAM 0.5 MG PO TABS: 0.5000 mg | ORAL_TABLET | Freq: Every evening | ORAL | Status: DC | PRN

## 2013-04-18 NOTE — Telephone Encounter (Signed)
Rx printed. Meds ordered this encounter  Medications  . ALPRAZolam (XANAX) 0.5 MG tablet    Sig: Take 1-2 tablets (0.5-1 mg total) by mouth at bedtime as needed for sleep or anxiety.    Dispense:  30 tablet    Refill:  0    Order Specific Question:  Supervising Provider    Answer:  DOOLITTLE, ROBERT P [[id number]]

## 2013-04-19 NOTE — Telephone Encounter (Signed)
Faxed

## 2013-05-10 ENCOUNTER — Ambulatory Visit (INDEPENDENT_AMBULATORY_CARE_PROVIDER_SITE_OTHER): Payer: BC Managed Care – PPO | Admitting: Physician Assistant

## 2013-05-10 ENCOUNTER — Encounter: Payer: Self-pay | Admitting: Physician Assistant

## 2013-05-10 VITALS — BP 108/62 | HR 64 | Temp 99.2°F | Resp 16 | Ht 62.25 in | Wt 130.6 lb

## 2013-05-10 DIAGNOSIS — G4700 Insomnia, unspecified: Secondary | ICD-10-CM

## 2013-05-10 DIAGNOSIS — F419 Anxiety disorder, unspecified: Secondary | ICD-10-CM

## 2013-05-10 DIAGNOSIS — F411 Generalized anxiety disorder: Secondary | ICD-10-CM

## 2013-05-10 NOTE — Progress Notes (Signed)
  Subjective:    Patient ID: Allison Miller, female    DOB: Mar 27, 1971, 42 y.o.   MRN: [id number]  HPI  This 42 y.o. female presents for evaluation of insomnia and anxiety. As decreased citalopram, had lots of emotional lability.  Increased back up to 40 mg. Increased the Wellbutrin XL to 300 mg, but didn't see any changes, so went back to 150 mg daily.  Doesn't feel any different than she did on the citalopram alone. Difficulty sleeping-alprazolam helps, but doesn't want to "need something to sleep."  She is using some relaxation techniques, has reduced caffeine, and is thinking that she'd like to just continue on the current meds for now and see how things go for the next few months.  Medications, allergies, past medical history, surgical history, family history, social history and problem list reviewed.   Review of Systems No chest pain, SOB, HA, dizziness, vision change, N/V, diarrhea, constipation, dysuria, urinary urgency or frequency, myalgias, arthralgias or rash.     Objective:   Physical Exam Blood pressure 108/62, pulse 64, temperature 99.2 F (37.3 C), temperature source Oral, resp. rate 16, height 5' 2.25" (1.581 m), weight 130 lb 9.6 oz (59.24 kg), SpO2 98.00%. Body mass index is 23.7 kg/(m^2). Well-developed, well nourished WF who is awake, alert and oriented, in NAD. HEENT: [place]/AT, sclera and conjunctiva are clear.   Neck: supple, non-tender, no lymphadenopathy, thyromegaly. Heart: RRR, no murmur Lungs: normal effort, CTA Extremities: no cyanosis, clubbing or edema. Skin: warm and dry without rash. Psychologic: good mood and appropriate affect, normal speech and behavior.        Assessment & Plan:  Insomnia  Anxiety   Continue citalopram 40 mg QD and Wellbutrin XL 150 mg QD for now.  Continue working on sleep hygiene and relaxation techniques.  Reassess in 3-6 months, sooner if her symptoms worsen, or she decides she'd like to try alternative treatment  options.  Fernande Bras, PA-C Physician Assistant-Certified [facility]

## 2013-05-10 NOTE — Patient Instructions (Signed)
Continue your current medications. Work on some relaxation techniques to help turn off your brain at night. If you don't have any improvements in the next 6-8 weeks, sooner if you need to.

## 2013-06-05 ENCOUNTER — Telehealth: Payer: Self-pay

## 2013-06-05 DIAGNOSIS — F419 Anxiety disorder, unspecified: Secondary | ICD-10-CM

## 2013-06-05 DIAGNOSIS — A6000 Herpesviral infection of urogenital system, unspecified: Secondary | ICD-10-CM

## 2013-06-05 MED ORDER — VALACYCLOVIR HCL 500 MG PO TABS: 500.0000 mg | ORAL_TABLET | Freq: Two times a day (BID) | ORAL | Status: DC

## 2013-06-05 MED ORDER — BUPROPION HCL ER (XL) 150 MG PO TB24: 150.0000 mg | ORAL_TABLET | Freq: Every day | ORAL | Status: DC

## 2013-06-05 NOTE — Telephone Encounter (Signed)
Spoke with pt, she needs Valtrex and Wellbutrin sent to [facility] [address]. Can we still do 3 month supply for Wellbutrin? Please advise

## 2013-06-05 NOTE — Telephone Encounter (Signed)
Pt needs prescriptions sent [facility] on [address] instead of [place] because of prices. [facility] her. Call at [phone].

## 2013-06-05 NOTE — Telephone Encounter (Signed)
LMOM to CB to let us know which RX's will be sent to [facility].

## 2013-06-05 NOTE — Telephone Encounter (Signed)
Sent to [facility] [address]!  °

## 2013-06-08 ENCOUNTER — Telehealth: Payer: Self-pay

## 2013-06-08 DIAGNOSIS — G4700 Insomnia, unspecified: Secondary | ICD-10-CM

## 2013-06-08 DIAGNOSIS — F419 Anxiety disorder, unspecified: Secondary | ICD-10-CM

## 2013-06-08 MED ORDER — ALPRAZOLAM 0.5 MG PO TABS: 0.5000 mg | ORAL_TABLET | Freq: Every evening | ORAL | Status: DC | PRN

## 2013-06-08 NOTE — Telephone Encounter (Signed)
Rx printed.  Meds ordered this encounter  Medications  . ALPRAZolam (XANAX) 0.5 MG tablet    Sig: Take 1-2 tablets (0.5-1 mg total) by mouth at bedtime as needed for sleep or anxiety.    Dispense:  30 tablet    Refill:  0    Order Specific Question:  Supervising Provider    Answer:  DOOLITTLE, ROBERT P [[id number]]

## 2013-06-08 NOTE — Telephone Encounter (Signed)
Please advise on request, have pended changed pharmacy.

## 2013-06-08 NOTE — Telephone Encounter (Signed)
Patient is calling to see if we can call [facility]anex into the [facility] [address] because [facility] does not have it call if questions to [phone]

## 2013-06-08 NOTE — Telephone Encounter (Signed)
RX called in to [facility] [address]. Left message to notify patient

## 2013-07-05 ENCOUNTER — Telehealth: Payer: Self-pay | Admitting: *Deleted

## 2013-07-05 ENCOUNTER — Other Ambulatory Visit: Payer: Self-pay | Admitting: Physician Assistant

## 2013-07-05 NOTE — Telephone Encounter (Signed)
Faxed [facility] [phone], per C Jeffery PA-C.

## 2013-08-01 ENCOUNTER — Other Ambulatory Visit: Payer: Self-pay | Admitting: Physician Assistant

## 2013-08-02 NOTE — Telephone Encounter (Signed)
Called patient to let her know to pick up Xanax prescription at the [address] walk in clinic before 8:30 pm. Also, she will have to signed for RX.

## 2013-08-02 NOTE — Telephone Encounter (Signed)
Printed:  Meds ordered this encounter  Medications  . ALPRAZolam (XANAX) 0.5 MG tablet    Sig: TAKE 1 TO 2 TABLETS BY MOUTH AT BEDTIME AS NEEDED FOR SLEEP    Dispense:  30 tablet    Refill:  0

## 2013-08-27 ENCOUNTER — Other Ambulatory Visit: Payer: Self-pay | Admitting: Internal Medicine

## 2013-08-30 NOTE — Telephone Encounter (Signed)
faxed

## 2013-09-13 ENCOUNTER — Other Ambulatory Visit: Payer: Self-pay | Admitting: Physician Assistant

## 2013-09-26 ENCOUNTER — Other Ambulatory Visit: Payer: Self-pay | Admitting: Physician Assistant

## 2013-10-04 ENCOUNTER — Ambulatory Visit: Payer: BC Managed Care – PPO | Admitting: Physician Assistant

## 2013-10-11 ENCOUNTER — Ambulatory Visit (INDEPENDENT_AMBULATORY_CARE_PROVIDER_SITE_OTHER): Payer: BC Managed Care – PPO | Admitting: Physician Assistant

## 2013-10-11 ENCOUNTER — Encounter: Payer: Self-pay | Admitting: Physician Assistant

## 2013-10-11 VITALS — BP 108/60 | HR 68 | Temp 98.1°F | Resp 16 | Ht 61.5 in | Wt 133.0 lb

## 2013-10-11 DIAGNOSIS — F419 Anxiety disorder, unspecified: Secondary | ICD-10-CM

## 2013-10-11 DIAGNOSIS — F411 Generalized anxiety disorder: Secondary | ICD-10-CM

## 2013-10-11 DIAGNOSIS — R41840 Attention and concentration deficit: Secondary | ICD-10-CM

## 2013-10-11 DIAGNOSIS — G4700 Insomnia, unspecified: Secondary | ICD-10-CM

## 2013-10-11 MED ORDER — ALPRAZOLAM 1 MG PO TABS: 0.5000 mg | ORAL_TABLET | Freq: Every evening | ORAL | Status: DC | PRN

## 2013-10-11 MED ORDER — AMPHETAMINE-DEXTROAMPHETAMINE 10 MG PO TABS: 5.0000 mg | ORAL_TABLET | Freq: Two times a day (BID) | ORAL | Status: DC

## 2013-10-11 NOTE — Progress Notes (Signed)
   Subjective:    Patient ID: Allison Miller, female    DOB: February 14, 1971, 43 y.o.   MRN: [id number]  HPI  Patient presents for follow up of anxiety. She would also like to discuss her recent lack of focus.   She has been taking citalopram 40 mg daily - she feels that this dose is working well for her. She is seeing a Social worker, Edrick Oh.   She is still taking xanax PRN for insomnia which she feels is well controlled - reports taking xanax every third night. occassionally she will take 2 tablets when "her mind is going crazy" but typically takes 1 tablet.   Reports that she is having difficulty focusing that has worsened over the last 2 months. She is getting very distracted by little things at work. If she starts something at home, she gets distracted and does not finish it and then forget about it. Would like to discuss using herbal supplements to help control focus. She has Tour manager (Vitmain C, Folic Acid and D35 combo), oregano, Ginseng, Kava Kava Root - she has not yet tried any of these supplements but would like to see if they could potentially help her to stay more focused.   Review of Systems As above.     Objective:   Physical Exam  Constitutional: She is oriented to person, place, and time. She appears well-developed and well-nourished.  HENT:  Head: Normocephalic and atraumatic.  Cardiovascular: Normal rate, regular rhythm and normal heart sounds.   Pulmonary/Chest: Effort normal and breath sounds normal.  Neurological: She is alert and oriented to person, place, and time.  Skin: Skin is warm and dry.  Psychiatric: She has a normal mood and affect. Her behavior is normal. Judgment and thought content normal.       Assessment & Plan:   1. Anxiety Well controlled. Continue seeing counselor. Continue medication as prescribed.  - ALPRAZolam (XANAX) 1 MG tablet; Take 0.5-1 tablets (0.5-1 mg total) by mouth at bedtime as needed for anxiety or sleep.   Dispense: 30 tablet; Refill: 0  2. Insomnia Well controlled. Continue medication as prescribed.  - ALPRAZolam (XANAX) 1 MG tablet; Take 0.5-1 tablets (0.5-1 mg total) by mouth at bedtime as needed for anxiety or sleep.  Dispense: 30 tablet; Refill: 0  3. Inattention Discussed use of herbal supplements with patient. Advised to begin one at a time to ensure side effects and/or results are identifiable by start date of supplement. Begin Adderall 10 mg BID for inattention. May begin with .5 tablet in the morning and take another .5 in the evening if needed. May increase dose according to symptom improvement. If feeling jittery or hyper, may reduce dose. Will re-evaluate in 1 month.  - amphetamine-dextroamphetamine (ADDERALL) 10 MG tablet; Take 0.5-1 tablets (5-10 mg total) by mouth 2 (two) times daily with a meal.  Dispense: 60 tablet; Refill: 0

## 2013-10-12 NOTE — Progress Notes (Signed)
I have examined this patient along with the student and agree.  

## 2013-10-25 ENCOUNTER — Other Ambulatory Visit: Payer: Self-pay | Admitting: Physician Assistant

## 2013-11-03 ENCOUNTER — Other Ambulatory Visit: Payer: Self-pay | Admitting: Physician Assistant

## 2013-11-05 NOTE — Telephone Encounter (Signed)
Chelle, you saw pt last month but not for GERD. Can we RF?

## 2013-11-13 ENCOUNTER — Other Ambulatory Visit: Payer: Self-pay | Admitting: Physician Assistant

## 2013-11-14 NOTE — Telephone Encounter (Signed)
Please phone this in: Meds ordered this encounter  Medications  . ALPRAZolam (XANAX) 1 MG tablet    Sig: TAKE 1/2 TO 1 TABLET AT BEDTIME AS NEEDED FOR SLEEP OR ANXIETY    Dispense:  30 tablet    Refill:  0

## 2013-11-14 NOTE — Telephone Encounter (Signed)
Called in.

## 2013-11-28 ENCOUNTER — Telehealth: Payer: Self-pay

## 2013-11-28 DIAGNOSIS — R41840 Attention and concentration deficit: Secondary | ICD-10-CM

## 2013-11-28 MED ORDER — AMPHETAMINE-DEXTROAMPHETAMINE 10 MG PO TABS: 5.0000 mg | ORAL_TABLET | Freq: Two times a day (BID) | ORAL | Status: DC

## 2013-11-28 NOTE — Telephone Encounter (Signed)
Notified pt Rx ready.

## 2013-11-28 NOTE — Telephone Encounter (Signed)
Refill  amphetamine-dextroamphetamine (ADDERALL) 10 MG tablet   [id number]

## 2013-11-28 NOTE — Telephone Encounter (Signed)
Meds ordered this encounter  Medications  . amphetamine-dextroamphetamine (ADDERALL) 10 MG tablet    Sig: Take 0.5-1 tablets (5-10 mg total) by mouth 2 (two) times daily with a meal.    Dispense:  60 tablet    Refill:  0    Order Specific Question:  Supervising Provider    Answer:  DOOLITTLE, ROBERT P [[id number]]   Printed. Please notify patient.

## 2013-12-11 ENCOUNTER — Other Ambulatory Visit: Payer: Self-pay | Admitting: Physician Assistant

## 2013-12-12 NOTE — Telephone Encounter (Signed)
Faxed

## 2014-01-16 ENCOUNTER — Other Ambulatory Visit: Payer: Self-pay | Admitting: Physician Assistant

## 2014-01-17 NOTE — Telephone Encounter (Signed)
Needs refill for xanax. [facility] [address]

## 2014-01-18 NOTE — Telephone Encounter (Signed)
Called in Rx and

## 2014-01-18 NOTE — Telephone Encounter (Signed)
Notified pt. 

## 2014-01-22 ENCOUNTER — Other Ambulatory Visit: Payer: Self-pay | Admitting: Physician Assistant

## 2014-02-05 ENCOUNTER — Telehealth: Payer: Self-pay

## 2014-02-05 DIAGNOSIS — R41840 Attention and concentration deficit: Secondary | ICD-10-CM

## 2014-02-05 MED ORDER — AMPHETAMINE-DEXTROAMPHETAMINE 10 MG PO TABS
5.0000 mg | ORAL_TABLET | Freq: Two times a day (BID) | ORAL | Status: DC
Start: 2014-02-05 — End: 2014-06-02

## 2014-02-05 NOTE — Telephone Encounter (Signed)
Patient needs a refill on prescription for adderall.

## 2014-02-05 NOTE — Telephone Encounter (Signed)
Refilled for Chelle.

## 2014-02-05 NOTE — Telephone Encounter (Signed)
Pt notified that it is up front for p/u 

## 2014-02-19 ENCOUNTER — Other Ambulatory Visit: Payer: Self-pay | Admitting: Physician Assistant

## 2014-02-20 NOTE — Telephone Encounter (Signed)
Faxed

## 2014-03-16 ENCOUNTER — Other Ambulatory Visit: Payer: Self-pay | Admitting: Physician Assistant

## 2014-03-18 NOTE — Telephone Encounter (Signed)
Chelle, I don't see this med discussed at any recent OVs. Do you want to RF? It looks like pt is due for f/up in Aug, put note on RF for review.

## 2014-03-21 ENCOUNTER — Other Ambulatory Visit: Payer: Self-pay | Admitting: Physician Assistant

## 2014-03-22 NOTE — Telephone Encounter (Signed)
Faxed

## 2014-04-03 ENCOUNTER — Encounter: Payer: Self-pay | Admitting: Family Medicine

## 2014-04-03 ENCOUNTER — Ambulatory Visit (INDEPENDENT_AMBULATORY_CARE_PROVIDER_SITE_OTHER): Payer: BC Managed Care – PPO | Admitting: Family Medicine

## 2014-04-03 VITALS — BP 98/64 | HR 66 | Temp 99.1°F | Resp 16 | Ht 61.5 in | Wt 125.6 lb

## 2014-04-03 DIAGNOSIS — J209 Acute bronchitis, unspecified: Secondary | ICD-10-CM

## 2014-04-03 MED ORDER — AZITHROMYCIN 250 MG PO TABS: ORAL_TABLET | ORAL | Status: DC

## 2014-04-03 MED ORDER — HYDROCOD POLST-CHLORPHEN POLST 10-8 MG/5ML PO LQCR: 5.0000 mL | Freq: Every evening | ORAL | Status: DC | PRN

## 2014-04-03 NOTE — Patient Instructions (Signed)

## 2014-04-03 NOTE — Progress Notes (Signed)
This chart was scribed for Wardell Honour, MD by Thea Alken, ED Scribe. This patient was seen in room 25 and the patient's care was started at 4:02 PM. Subjective:    Patient ID: Allison Miller, female    DOB: June 03, 1971, 43 y.o.   MRN: [id number]  HPI Chief Complaint  Patient presents with  . chest cold, deep cough x 1-1/2 wks    occas. light colored mucus   HPI Comments: Allison Miller is a 43 y.o. female who presents to the [facility] complaining of a chest cold onset 10 days. She reports a mild productive cough, chest congestion, and a HA. She has tried mucinex for 7 days without relief. Pt has had sick contacts consisting of her sister who was diagnosed with bronchitis. She denies fever, chills, wheezing, diaphoresis, rhinorrhea, and nasal congestion. She denies asthma. She has h/o anxiety and insomnia. 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 Pt is going on vacation in 10 days. Pt denies recent travel out of the country. Pt denies allergies.  Denies tobacco abuse.  Past Medical History  Diagnosis Date  . Anxiety   . Insomnia    Past Surgical History  Procedure Laterality Date  . Tonsillectomy  1998   Prior to Admission medications   Medication Sig Start Date End Date Taking? Authorizing Provider  ALPRAZolam (XANAX) 1 MG tablet TAKE 1/2 TO 1 TABLET AT BEDTIME AS NEEDED FOR SLEEP OR ANXIETY   Yes Chelle S Jeffery, PA-C  amphetamine-dextroamphetamine (ADDERALL) 10 MG tablet Take 0.5-1 tablets (5-10 mg total) by mouth 2 (two) times daily with a meal. 02/05/14  Yes Ryan M Dunn, PA-C  citalopram (CELEXA) 40 MG tablet TAKE 1 TABLET EVERY DAY   Yes Chelle S Jeffery, PA-C  levonorgestrel (MIRENA) 20 MCG/24HR IUD 1 each by Intrauterine route once.   Yes Historical Provider, MD  omeprazole (PRILOSEC) 40 MG capsule TAKE ONE CAPSULE EVERY DAY   Yes Chelle S Jeffery, PA-C  Probiotic Product (VSL#3) CAPS Take 1 capsule by mouth 2 (two) times daily. PATIENT  NEEDS OFFICE VISIT FOR ADDITIONAL REFILLS   Yes Chelle S Jeffery, PA-C  valACYclovir (VALTREX) 500 MG tablet Take 1 tablet (500 mg total) by mouth 2 (two) times daily. 06/05/13  Yes Eleanore E Elana Alm, PA-C  buPROPion (WELLBUTRIN XL) 150 MG 24 hr tablet Take 1 tablet (150 mg total) by mouth daily. 06/05/13   Theda Sers, PA-C   Review of Systems  Constitutional: Negative for fever, chills and diaphoresis.  HENT: Negative for congestion, ear pain, rhinorrhea, sneezing and sore throat.   Respiratory: Positive for cough. Negative for shortness of breath and wheezing.   Gastrointestinal: Negative for nausea, vomiting, abdominal pain and diarrhea.  Allergic/Immunologic: Negative for environmental allergies and food allergies.  Neurological: Positive for headaches.   Objective:   Physical Exam  Nursing note and vitals reviewed. Constitutional: She is oriented to person, place, and time. She appears well-developed and well-nourished. No distress.  HENT:  Head: Normocephalic and atraumatic.  Right Ear: External ear normal.  Left Ear: External ear normal.  Nose: Nose normal.  Mouth/Throat: Oropharynx is clear and moist.  Eyes: Conjunctivae and EOM are normal. Pupils are equal, round, and reactive to light.  Neck: Neck supple. No thyromegaly present.  Cardiovascular: Normal rate, regular rhythm and normal heart sounds.  Exam reveals no gallop and no friction rub.   No murmur heard. Pulmonary/Chest: Effort normal and breath sounds normal. No respiratory  distress. She has no wheezes. She has no rales. She exhibits no tenderness.  Musculoskeletal: Normal range of motion.  Lymphadenopathy:    She has no cervical adenopathy.  Neurological: She is alert and oriented to person, place, and time.  Skin: Skin is warm and dry. No rash noted. She is not diaphoretic.  Psychiatric: She has a normal mood and affect. Her behavior is normal.   Filed Vitals:   04/03/14 1556  BP: 98/64  Pulse: 66  Temp: 99.1  F (37.3 C)  Resp: 16   Assessment & Plan:   1. Acute bronchitis, unspecified organism    1. Acute bronchitis: New.  Onset ten days ago. Most consistent with viral syndrome.  Treat supportively with continued Mucinex bid.  Rx for Tussionex provided for nighttime use.  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    Meds ordered this encounter  Medications  . azithromycin (ZITHROMAX) 250 MG tablet    Sig: Two tablets daily x 1 day then one tablet daily x 4 days    Dispense:  6 tablet    Refill:  0  . chlorpheniramine-HYDROcodone (TUSSIONEX) 10-8 MG/5ML LQCR    Sig: Take 5 mLs by mouth at bedtime as needed for cough.    Dispense:  180 mL    Refill:  0   I personally performed the services described in this documentation, which was scribed in my presence. The recorded information has been reviewed and is accurate.  Reginia Forts, M.D.  [facility] [address] [place], [place]  [address] [phone] phone [phone] fax

## 2014-04-23 ENCOUNTER — Other Ambulatory Visit: Payer: Self-pay | Admitting: Physician Assistant

## 2014-04-24 NOTE — Telephone Encounter (Signed)
Pt is checking on status of the refill request of her zanax.

## 2014-04-25 NOTE — Telephone Encounter (Signed)
Faxed

## 2014-04-25 NOTE — Telephone Encounter (Signed)
Notified pt on VM. 

## 2014-05-23 ENCOUNTER — Other Ambulatory Visit: Payer: Self-pay | Admitting: Physician Assistant

## 2014-05-25 NOTE — Telephone Encounter (Signed)
PATIENT IS LOOKING FOR ALPRAZolam (XANAX) 1 MG tablet  It's been three days and she is desperate.  [facility] [place]  [phone]

## 2014-05-26 NOTE — Telephone Encounter (Signed)
Patient called again to check status on refill request. Told her Chelle would be back in office tomorrow 05/27/14. Please advise

## 2014-05-27 NOTE — Telephone Encounter (Signed)
Faxed Rx and notified pt. 

## 2014-06-02 ENCOUNTER — Ambulatory Visit (INDEPENDENT_AMBULATORY_CARE_PROVIDER_SITE_OTHER): Payer: BC Managed Care – PPO | Admitting: Physician Assistant

## 2014-06-02 VITALS — BP 100/60 | HR 63 | Temp 97.9°F | Resp 16 | Ht 62.0 in | Wt 125.0 lb

## 2014-06-02 DIAGNOSIS — F419 Anxiety disorder, unspecified: Secondary | ICD-10-CM

## 2014-06-02 DIAGNOSIS — Z23 Encounter for immunization: Secondary | ICD-10-CM

## 2014-06-02 DIAGNOSIS — G4700 Insomnia, unspecified: Secondary | ICD-10-CM

## 2014-06-02 DIAGNOSIS — R41840 Attention and concentration deficit: Secondary | ICD-10-CM

## 2014-06-02 DIAGNOSIS — A6000 Herpesviral infection of urogenital system, unspecified: Secondary | ICD-10-CM

## 2014-06-02 DIAGNOSIS — Z0000 Encounter for general adult medical examination without abnormal findings: Secondary | ICD-10-CM

## 2014-06-02 DIAGNOSIS — Z13220 Encounter for screening for lipoid disorders: Secondary | ICD-10-CM

## 2014-06-02 DIAGNOSIS — Z131 Encounter for screening for diabetes mellitus: Secondary | ICD-10-CM

## 2014-06-02 DIAGNOSIS — Z130 Encounter for screening for diseases of the blood and blood-forming organs and certain disorders involving the immune mechanism: Secondary | ICD-10-CM

## 2014-06-02 LAB — LIPID PANEL
CHOL/HDL RATIO: 2.2 ratio
Cholesterol: 129 mg/dL (ref 0–200)
HDL: 58 mg/dL (ref >39)
LDL Cholesterol: 55 mg/dL (ref 0–99)
Triglycerides: 82 mg/dL (ref <150)
VLDL: 16 mg/dL (ref 0–40)

## 2014-06-02 LAB — POCT CBC
Granulocyte percent: 60.3 %G (ref 37–80)
HEMATOCRIT: 43.2 % (ref 37.7–47.9)
Hemoglobin: 14 g/dL (ref 12.2–16.2)
LYMPH, POC: 2.2 (ref 0.6–3.4)
MCH: 30.6 pg (ref 27–31.2)
MCHC: 32.3 g/dL (ref 31.8–35.4)
MCV: 94.6 fL (ref 80–97)
MID (cbc): 0.2 (ref 0–0.9)
MPV: 7.8 fL (ref 0–99.8)
POC Granulocyte: 3.7 (ref 2–6.9)
POC LYMPH PERCENT: 36.1 %L (ref 10–50)
POC MID %: 3.6 % (ref 0–12)
Platelet Count, POC: 191 10*3/uL (ref 142–424)
RBC: 4.57 M/uL (ref 4.04–5.48)
RDW, POC: 11.9 %
WBC: 6.2 10*3/uL (ref 4.6–10.2)

## 2014-06-02 LAB — COMPREHENSIVE METABOLIC PANEL
ALT: 26 U/L (ref 0–35)
AST: 26 U/L (ref 0–37)
Albumin: 4.1 g/dL (ref 3.5–5.2)
Alkaline Phosphatase: 38 U/L — ABNORMAL LOW (ref 39–117)
BILIRUBIN TOTAL: 0.5 mg/dL (ref 0.2–1.2)
BUN: 12 mg/dL (ref 6–23)
CO2: 28 mEq/L (ref 19–32)
Calcium: 9.1 mg/dL (ref 8.4–10.5)
Chloride: 102 mEq/L (ref 96–112)
Creat: 0.7 mg/dL (ref 0.50–1.10)
Glucose, Bld: 80 mg/dL (ref 70–99)
Potassium: 4.1 mEq/L (ref 3.5–5.3)
Sodium: 136 mEq/L (ref 135–145)
Total Protein: 6.4 g/dL (ref 6.0–8.3)

## 2014-06-02 LAB — POCT UA - MICROSCOPIC ONLY
Bacteria, U Microscopic: NEGATIVE
CASTS, UR, LPF, POC: NEGATIVE
Crystals, Ur, HPF, POC: NEGATIVE
EPITHELIAL CELLS, URINE PER MICROSCOPY: NEGATIVE
MUCUS UA: NEGATIVE
RBC, urine, microscopic: NEGATIVE
WBC, Ur, HPF, POC: NEGATIVE
YEAST UA: NEGATIVE

## 2014-06-02 LAB — TSH: TSH: 1.154 u[IU]/mL (ref 0.350–4.500)

## 2014-06-02 MED ORDER — ALPRAZOLAM 1 MG PO TABS: ORAL_TABLET | ORAL | Status: DC

## 2014-06-02 MED ORDER — VALACYCLOVIR HCL 500 MG PO TABS: 500.0000 mg | ORAL_TABLET | Freq: Two times a day (BID) | ORAL | Status: DC

## 2014-06-02 MED ORDER — AMPHETAMINE-DEXTROAMPHETAMINE 10 MG PO TABS: 5.0000 mg | ORAL_TABLET | Freq: Two times a day (BID) | ORAL | Status: DC

## 2014-06-02 NOTE — Patient Instructions (Signed)
I will contact you with your lab results as soon as they are available.   If you have not heard from me in 2 weeks, please contact me.  The fastest way to get your results is to register for My Chart (see the instructions on the last page of this printout).  Increase the omeprazole to every day. Add OTC Zantac twice daily for the next few weeks, until the GI symptoms resolve.  If they remain controlled, you can go back to using the omeprazole as needed.  Keeping You Healthy  Get These Tests 1. Blood Pressure- Have your blood pressure checked once a year by your health care provider.  Normal blood pressure is 120/80. 2. Weight- Have your body mass index (BMI) calculated to screen for obesity.  BMI is measure of body fat based on height and weight.  You can also calculate your own BMI at GravelBags.it. 3. Cholesterol- Have your cholesterol checked every 5 years starting at age 49 then yearly starting at age 48. 20. Chlamydia, HIV, and other sexually transmitted diseases- Get screened every year until age 45, then within three months of each new sexual provider. 5. Pap Smear- Every 1-5 years; discuss with your health care provider. 6. Mammogram- Every year starting at age 19  Take these medicines  Calcium with Vitamin D-Your body needs 1200 mg of Calcium each day and [id number] IU of Vitamin D daily.  Your body can only absorb 500 mg of Calcium at a time so Calcium must be taken in 2 or 3 divided doses throughout the day.  Multivitamin with folic acid- Once daily if it is possible for you to become pregnant.  Get these Immunizations  Gardasil-Series of three doses; prevents HPV related illness such as genital warts and cervical cancer.  Menactra-Single dose; prevents meningitis.  Tetanus shot- Every 10 years.  Flu shot-Every year.  Take these steps 1. Do not smoke-Your healthcare provider can help you quit.  For tips on how to quit go to www.smokefree.gov or call 1-800  QUITNOW. 2. Be physically active- Exercise 5 days a week for at least 30 minutes.  If you are not already physically active, start slow and gradually work up to 30 minutes of moderate physical activity.  Examples of moderate activity include walking briskly, dancing, swimming, bicycling, etc. 3. Breast Cancer- A self breast exam every month is important for early detection of breast cancer.  For more information and instruction on self breast exams, ask your healthcare provider or https://www.patel.info/. 4. Eat a healthy diet- Eat a variety of healthy foods such as fruits, vegetables, whole grains, low fat milk, low fat cheeses, yogurt, lean meats, poultry and fish, beans, nuts, tofu, etc.  For more information go to www. Thenutritionsource.org 5. Drink alcohol in moderation- Limit alcohol intake to one drink or less per day. Never drink and drive. 6. Depression- Your emotional health is as important as your physical health.  If you're feeling down or losing interest in things you normally enjoy please talk to your healthcare provider about being screened for depression. 7. Dental visit- Brush and floss your teeth twice daily; visit your dentist twice a year. 8. Eye doctor- Get an eye exam at least every 2 years. 9. Helmet use- Always wear a helmet when riding a bicycle, motorcycle, rollerblading or skateboarding. 60. Safe sex- If you may be exposed to sexually transmitted infections, use a condom. 11. Seat belts- Seat belts can save your live; always wear one. 12. Smoke/Carbon Monoxide detectors- These  detectors need to be installed on the appropriate level of your home. Replace batteries at least once a year. 13. Skin cancer- When out in the sun please cover up and use sunscreen 15 SPF or higher. 14. Violence- If anyone is threatening or hurting you, please tell your healthcare provider.

## 2014-06-02 NOTE — Progress Notes (Signed)
Subjective:    Patient ID: Allison Miller, female    DOB: 02-14-1971, 43 y.o.   MRN: [id number]   PCP: No PCP Per Patient  Chief Complaint  Patient presents with  . Annual Exam    with labs      Active Ambulatory Problems    Diagnosis Date Noted  . Insomnia 08/17/2012  . Anxiety 08/17/2012  . 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 No Resolved Ambulatory Problems   Past Medical History  Diagnosis Date  . GERD (gastroesophageal reflux disease)     Past Surgical History  Procedure Laterality Date  . Tonsillectomy  1998    No Known Allergies  Prior to Admission medications   Medication Sig Start Date End Date Taking? Authorizing Provider  ALPRAZolam (XANAX) 1 MG tablet TAKE 1/2-1 TABLET BY MOUTH AT BEDTIME AS NEEDED FOR SLEEP OR ANXIETY. May fill on/after 06/26/2014. 06/02/14  Yes Jamaya Sleeth S Kahlee Metivier, PA-C  ALPRAZolam (XANAX) 1 MG tablet TAKE 1/2-1 TABLET BY MOUTH AT BEDTIME AS NEEDED FOR SLEEP OR ANXIETY. May fill on/after 07/26/2014 06/02/14  Yes Luvena Wentling S Charle Clear, PA-C  ALPRAZolam (XANAX) 1 MG tablet TAKE 1/2-1 TABLET BY MOUTH AT BEDTIME AS NEEDED FOR SLEEP OR ANXIETY. May fill on/after 08/24/2014 06/02/14  Yes Araina Butrick Janalee Dane, PA-C  amphetamine-dextroamphetamine (ADDERALL) 10 MG tablet Take 0.5-1 tablets (5-10 mg total) by mouth 2 (two) times daily with a meal. 06/02/14  Yes Shylo Zamor S Benn Tarver, PA-C  citalopram (CELEXA) 40 MG tablet TAKE 1 TABLET EVERY DAY   Yes Karlos Scadden S Lala Been, PA-C  levonorgestrel (MIRENA) 20 MCG/24HR IUD 1 each by Intrauterine route once.   Yes Historical Provider, MD  omeprazole (PRILOSEC) 40 MG capsule TAKE ONE CAPSULE EVERY DAY   Yes Londyn Wotton S Tynetta Bachmann, PA-C  Probiotic Product (VSL#3) CAPS Take 1 capsule by mouth 2 (two) times daily. PATIENT NEEDS OFFICE VISIT FOR ADDITIONAL REFILLS   Yes Hazyl Marseille S Tashon Capp, PA-C  valACYclovir (VALTREX) 500 MG tablet Take 1 tablet (500 mg total) by mouth 2 (two) times daily.  06/02/14  Yes Fara Chute, PA-C    History   Social History  . Marital Status: Single    Spouse Name: N/A    Number of Children: N/A  . Years of Education: N/A   Social History Main Topics  . Smoking status: Never Smoker   . Smokeless tobacco: None  . Alcohol Use: Yes     Comment: 2-4 drinkls a mth  . Drug Use: No  . Sexual Activity: None   Other Topics Concern  . None   Social History Narrative  . None    family history includes Healthy in her father; Hypertension in her mother and sister; Stroke in her maternal grandfather and paternal grandfather. indicated that her mother is alive. She indicated that her father is alive. She indicated that both of her sisters are alive. She indicated that both of her brothers are alive. She indicated that her maternal grandmother is alive. She indicated that her maternal grandfather is deceased. She indicated that her paternal grandmother is deceased. She indicated that her paternal grandfather is deceased.   HPI  Hope presents for an annual wellness exam.  She gets GYN care elsewhere.  She has a list of things that she's worried about and requests reassurance. "I don't want to be a hypohcondriac like my mother."  Celexa is working generally. "Xanax is my saviour for bedtime." Only uses Adderall when  she's feeling super distracted, but needs a refill now. Feels like she's more sensitive emotionally than previously-"I break out in goosebumps" when she hears emotionally charged or moving stories. History of H pylori infection.  1-2 months of dyspepsia, and is taking omeprazole on a PRN basis.  Stomach making noises, increased gas, bloated. Unable to identify specific foods that cause symptoms-happens every time she eats. Urge to defecate, like diarrhea, but then there's nothing. Exercising regularly. June GYN exam. Mammogram revealed dense breast tissue, but no lesions concerning for malignancy. Grinds her teeth, a lot. Notes squaring of  her law when she clenches her jaw. "Mask of pregnancy" pigment changes on her face, despite not being pregnant. She's talked with her dermatologist who suggested it could be hormonal, and she plans to discuss it with GYN when she gets her Mirena changed out in the next couple of months.  Review of Systems As above. Otherwise negative.    Objective:   Physical Exam  Vitals reviewed. Constitutional: She is oriented to person, place, and time. Vital signs are normal. She appears well-developed and well-nourished. She is active and cooperative. No distress.  BP 100/60  Pulse 63  Temp(Src) 97.9 F (36.6 C) (Oral)  Resp 16  Ht 5\' 2"  (1.575 m)  Wt 125 lb (56.7 kg)  BMI 22.86 kg/m2  SpO2 98%   HENT:  Head: Normocephalic and atraumatic.  Right Ear: Hearing, tympanic membrane, external ear and ear canal normal. No foreign bodies.  Left Ear: Hearing, tympanic membrane, external ear and ear canal normal. No foreign bodies.  Nose: Nose normal.  Mouth/Throat: Uvula is midline, oropharynx is clear and moist and mucous membranes are normal. No oral lesions. Normal dentition. No dental abscesses or uvula swelling. No oropharyngeal exudate.  Eyes: Conjunctivae, EOM and lids are normal. Pupils are equal, round, and reactive to light. Right eye exhibits no discharge. Left eye exhibits no discharge. No scleral icterus.  Fundoscopic exam:      The right eye shows no arteriolar narrowing, no AV nicking, no exudate, no hemorrhage and no papilledema. The right eye shows red reflex.       The left eye shows no arteriolar narrowing, no AV nicking, no exudate, no hemorrhage and no papilledema. The left eye shows red reflex.  Neck: Trachea normal, normal range of motion and full passive range of motion without pain. Neck supple. No spinous process tenderness and no muscular tenderness present. No mass and no thyromegaly present.  Cardiovascular: Normal rate, regular rhythm, normal heart sounds, intact distal  pulses and normal pulses.   Pulmonary/Chest: Effort normal and breath sounds normal.  Musculoskeletal: She exhibits no edema and no tenderness.       Cervical back: Normal.       Thoracic back: Normal.       Lumbar back: Normal.  Lymphadenopathy:       Head (right side): No tonsillar, no preauricular, no posterior auricular and no occipital adenopathy present.       Head (left side): No tonsillar, no preauricular, no posterior auricular and no occipital adenopathy present.    She has no cervical adenopathy.       Right: No supraclavicular adenopathy present.       Left: No supraclavicular adenopathy present.  Neurological: She is alert and oriented to person, place, and time. She has normal strength and normal reflexes. No cranial nerve deficit. She exhibits normal muscle tone. Coordination and gait normal.  Skin: Skin is warm, dry and intact. No rash  noted. She is not diaphoretic. No cyanosis or erythema. Nails show no clubbing.  Hyperpigmentation of the cheeks bilaterally. No raised lesions.  Psychiatric: She has a normal mood and affect. Her speech is normal and behavior is normal. Judgment and thought content normal.   Results for orders placed in visit on 06/02/14  POCT CBC      Result Value Ref Range   WBC 6.2  4.6 - 10.2 K/uL   Lymph, poc 2.2  0.6 - 3.4   POC LYMPH PERCENT 36.1  10 - 50 %L   MID (cbc) 0.2  0 - 0.9   POC MID % 3.6  0 - 12 %M   POC Granulocyte 3.7  2 - 6.9   Granulocyte percent 60.3  37 - 80 %G   RBC 4.57  4.04 - 5.48 M/uL   Hemoglobin 14.0  12.2 - 16.2 g/dL   HCT, POC 43.2  37.7 - 47.9 %   MCV 94.6  80 - 97 fL   MCH, POC 30.6  27 - 31.2 pg   MCHC 32.3  31.8 - 35.4 g/dL   RDW, POC 11.9     Platelet Count, POC 191  142 - 424 K/uL   MPV 7.8  0 - 99.8 fL  POCT UA - MICROSCOPIC ONLY      Result Value Ref Range   WBC, Ur, HPF, POC neg     RBC, urine, microscopic neg     Bacteria, U Microscopic neg     Mucus, UA neg     Epithelial cells, urine per micros neg      Crystals, Ur, HPF, POC neg     Casts, Ur, LPF, POC neg     Yeast, UA neg            Assessment & Plan:  1. Annual physical exam Age appropriate anticipatory guidance provided.  2. Inattention Stable/controlled. Continue. - amphetamine-dextroamphetamine (ADDERALL) 10 MG tablet; Take 0.5-1 tablets (5-10 mg total) by mouth 2 (two) times daily with a meal.  Dispense: 60 tablet; Refill: 0  3. Insomnia 4. Anxiety Continue Alprazolam at HS PRN. - TSH  5. Genital HSV Stable. Continue. - valACYclovir (VALTREX) 500 MG tablet; Take 1 tablet (500 mg total) by mouth 2 (two) times daily.  Dispense: 180 tablet; Refill: 4  6. Screening for hyperlipidemia Await lab results. - Lipid panel  7. Screening for deficiency anemia Await CBC. - POCT CBC  8. Screening for diabetes mellitus - POCT UA - Microscopic Only - POCT urinalysis dipstick - Comprehensive metabolic panel  9. Need for influenza vaccination - Flu Vaccine QUAD 36+ mos IM  Return in about 6 months (around 12/02/2014).  Fara Chute, PA-C Physician Assistant-Certified [facility]

## 2014-06-03 ENCOUNTER — Encounter: Payer: Self-pay | Admitting: Physician Assistant

## 2014-06-05 ENCOUNTER — Encounter: Payer: BC Managed Care – PPO | Admitting: Family Medicine

## 2014-08-19 ENCOUNTER — Telehealth: Payer: Self-pay

## 2014-08-19 DIAGNOSIS — R41840 Attention and concentration deficit: Secondary | ICD-10-CM

## 2014-08-19 NOTE — Telephone Encounter (Signed)
Patient called stating she would like a med refill on heramphetamine-dextroamphetamine (ADDERALL) 10 MG tablet [[id number] prescription. Please contact at [phone]

## 2014-08-21 MED ORDER — AMPHETAMINE-DEXTROAMPHETAMINE 10 MG PO TABS: 5.0000 mg | ORAL_TABLET | Freq: Two times a day (BID) | ORAL | Status: DC

## 2014-08-21 NOTE — Telephone Encounter (Signed)
Pt notified. rx in pick up drawer.

## 2014-08-21 NOTE — Telephone Encounter (Signed)
Meds ordered this encounter  Medications  . amphetamine-dextroamphetamine (ADDERALL) 10 MG tablet    Sig: Take 0.5-1 tablets (5-10 mg total) by mouth 2 (two) times daily with a meal.    Dispense:  60 tablet    Refill:  0    Order Specific Question:  Supervising Provider    Answer:  DOOLITTLE, ROBERT P [[id number]

## 2014-11-04 ENCOUNTER — Other Ambulatory Visit: Payer: Self-pay

## 2014-11-04 DIAGNOSIS — R41840 Attention and concentration deficit: Secondary | ICD-10-CM

## 2014-11-04 NOTE — Telephone Encounter (Signed)
-  dextroamphetamine (ADDERALL) 10 MG tablet

## 2014-11-04 NOTE — Telephone Encounter (Signed)
Med refill on amphetamine-dextroamphetamine (ADDERALL) 10 MG tablet   Has an appointment with Chelle in May   [phone]

## 2014-11-05 NOTE — Telephone Encounter (Signed)
Pharm also faxed req for alprazolam RF. Pended both.

## 2014-11-06 ENCOUNTER — Encounter: Payer: Self-pay | Admitting: Physician Assistant

## 2014-11-06 MED ORDER — AMPHETAMINE-DEXTROAMPHETAMINE 10 MG PO TABS: 5.0000 mg | ORAL_TABLET | Freq: Two times a day (BID) | ORAL | Status: DC

## 2014-11-06 MED ORDER — ALPRAZOLAM 1 MG PO TABS: ORAL_TABLET | ORAL | Status: DC

## 2014-11-06 NOTE — Telephone Encounter (Signed)
Allison Miller, pt has appt sch w/you for 01/07/15. Do you need to see her at walk in before next RF, or OK for 1 more RF in April?

## 2014-11-06 NOTE — Telephone Encounter (Signed)
Notified pt Rxs ready and OK to wait for appt for f/up. She will call next mos for April RFs.

## 2014-11-06 NOTE — Telephone Encounter (Signed)
Rxs signed.  Please remind patient that she needs follow-up with me for the next fills.  Meds ordered this encounter  Medications  . amphetamine-dextroamphetamine (ADDERALL) 10 MG tablet    Sig: Take 0.5-1 tablets (5-10 mg total) by mouth 2 (two) times daily with a meal.    Dispense:  60 tablet    Refill:  0  . ALPRAZolam (XANAX) 1 MG tablet    Sig: TAKE 1/2-1 TABLET BY MOUTH AT BEDTIME AS NEEDED FOR SLEEP OR ANXIETY.    Dispense:  30 tablet    Refill:  0    Not to exceed 6 additional fills before 10/22/2014.

## 2014-11-06 NOTE — Telephone Encounter (Signed)
If she's scheduled to see me in May, that is fine. She doesn't need to come see me before that.

## 2014-11-09 NOTE — Telephone Encounter (Signed)
Can you check on this patient's appt

## 2014-12-06 ENCOUNTER — Telehealth: Payer: Self-pay

## 2014-12-06 MED ORDER — CITALOPRAM HYDROBROMIDE 40 MG PO TABS: 40.0000 mg | ORAL_TABLET | Freq: Every day | ORAL | Status: DC

## 2014-12-06 NOTE — Telephone Encounter (Signed)
Meds ordered this encounter  Medications  . citalopram (CELEXA) 40 MG tablet    Sig: Take 1 tablet (40 mg total) by mouth daily.    Dispense:  90 tablet    Refill:  3    Order Specific Question:  Supervising Provider    Answer:  DOOLITTLE, ROBERT P [[id number]]

## 2014-12-06 NOTE — Telephone Encounter (Signed)
Please advise on refilling pt's Celexa.

## 2014-12-06 NOTE — Telephone Encounter (Signed)
Patient is calling because she is out of Celexa and the pharmacy keeps denying the medication. Patient would like to know if we can refill it. Patient was informed that it takes at least 24 hours to refill medication not including the weekend. Patient asked if I could prioritize this message because she gets dizzy without her medication and she's going out of town this weekend.

## 2014-12-10 ENCOUNTER — Other Ambulatory Visit: Payer: Self-pay

## 2014-12-10 MED ORDER — ALPRAZOLAM 1 MG PO TABS: ORAL_TABLET | ORAL | Status: DC

## 2014-12-10 NOTE — Telephone Encounter (Signed)
Pt stated that she is getting low on her alprazolam although she has been trying to cut back recently to 1/2 tab. She as appt on 01/07/15. Pended RF.

## 2014-12-10 NOTE — Telephone Encounter (Signed)
Faxed and notified pt on VM. 

## 2014-12-10 NOTE — Telephone Encounter (Signed)
Called pt to make sure that the pharm had notified her that we sent the RF in of celexa. She thanked [place] and stated that she did p/up her medication.

## 2014-12-10 NOTE — Telephone Encounter (Signed)
Rx printed at 104. Will bring to 102 after clinic.  Meds ordered this encounter  Medications  . ALPRAZolam (XANAX) 1 MG tablet    Sig: TAKE 1/2-1 TABLET BY MOUTH AT BEDTIME AS NEEDED FOR SLEEP OR ANXIETY.    Dispense:  30 tablet    Refill:  0    Not to exceed 6 additional fills before 10/22/2014.

## 2015-01-02 ENCOUNTER — Telehealth: Payer: Self-pay | Admitting: Family Medicine

## 2015-01-02 DIAGNOSIS — R41840 Attention and concentration deficit: Secondary | ICD-10-CM

## 2015-01-02 MED ORDER — CITALOPRAM HYDROBROMIDE 40 MG PO TABS: 40.0000 mg | ORAL_TABLET | Freq: Every day | ORAL | Status: DC

## 2015-01-02 MED ORDER — ALPRAZOLAM 1 MG PO TABS: ORAL_TABLET | ORAL | Status: DC

## 2015-01-02 MED ORDER — AMPHETAMINE-DEXTROAMPHETAMINE 10 MG PO TABS: 5.0000 mg | ORAL_TABLET | Freq: Two times a day (BID) | ORAL | Status: DC

## 2015-01-02 NOTE — Telephone Encounter (Signed)
Called to reschedule patients appt to 03/06/15 @ 10:00 for a CPE she states that she need a refill of her medicine until she see you Duanne Moron, CELEXA and ADDERALL

## 2015-01-02 NOTE — Telephone Encounter (Signed)
Meds ordered this encounter  Medications  . ALPRAZolam (XANAX) 1 MG tablet    Sig: TAKE 1/2-1 TABLET BY MOUTH AT BEDTIME AS NEEDED FOR SLEEP OR ANXIETY.    Dispense:  30 tablet    Refill:  0    Not to exceed 6 additional fills before 10/22/2014.    Order Specific Question:  Supervising Provider    Answer:  DOOLITTLE, ROBERT P [[id number]]  . amphetamine-dextroamphetamine (ADDERALL) 10 MG tablet    Sig: Take 0.5-1 tablets (5-10 mg total) by mouth 2 (two) times daily with a meal.    Dispense:  60 tablet    Refill:  0    Order Specific Question:  Supervising Provider    Answer:  DOOLITTLE, ROBERT P [[id number]]  . citalopram (CELEXA) 40 MG tablet    Sig: Take 1 tablet (40 mg total) by mouth daily.    Dispense:  90 tablet    Refill:  3    Order Specific Question:  Supervising Provider    Answer:  DOOLITTLE, ROBERT P [[id number]]  . ALPRAZolam (XANAX) 1 MG tablet    Sig: TAKE 1/2-1 TABLET BY MOUTH AT BEDTIME AS NEEDED FOR SLEEP OR ANXIETY.    Dispense:  30 tablet    Refill:  0    May fill 60 days after date on prescription.    Order Specific Question:  Supervising Provider    Answer:  DOOLITTLE, ROBERT P [[id number]]  . ALPRAZolam (XANAX) 1 MG tablet    Sig: TAKE 1/2-1 TABLET BY MOUTH AT BEDTIME AS NEEDED FOR SLEEP OR ANXIETY.    Dispense:  30 tablet    Refill:  0    May fill 30 days after date on prescription.    Order Specific Question:  Supervising Provider    Answer:  DOOLITTLE, ROBERT P [[id number]]  . amphetamine-dextroamphetamine (ADDERALL) 10 MG tablet    Sig: Take 0.5-1 tablets (5-10 mg total) by mouth 2 (two) times daily with a meal.    Dispense:  60 tablet    Refill:  0    May fill 30 days after date on prescription.    Order Specific Question:  Supervising Provider    Answer:  DOOLITTLE, ROBERT P [[id number]]  . amphetamine-dextroamphetamine (ADDERALL) 10 MG tablet    Sig: Take 0.5-1 tablets (5-10 mg total) by mouth 2 (two) times daily with a meal.    Dispense:  60 tablet    Refill:  0    May fill  60 days after date on prescription.    Order Specific Question:  Supervising Provider    Answer:  Tami Lin P [[id number]]

## 2015-01-02 NOTE — Telephone Encounter (Signed)
Pt notified that rx is ready. 

## 2015-01-07 ENCOUNTER — Encounter: Payer: Self-pay | Admitting: Physician Assistant

## 2015-02-27 ENCOUNTER — Other Ambulatory Visit: Payer: Self-pay | Admitting: Physician Assistant

## 2015-03-06 ENCOUNTER — Ambulatory Visit (INDEPENDENT_AMBULATORY_CARE_PROVIDER_SITE_OTHER): Payer: BLUE CROSS/BLUE SHIELD | Admitting: Physician Assistant

## 2015-03-06 ENCOUNTER — Encounter: Payer: Self-pay | Admitting: Physician Assistant

## 2015-03-06 VITALS — BP 96/66 | HR 67 | Temp 98.3°F | Resp 16 | Ht 60.0 in | Wt 127.4 lb

## 2015-03-06 DIAGNOSIS — Z0000 Encounter for general adult medical examination without abnormal findings: Secondary | ICD-10-CM

## 2015-03-06 DIAGNOSIS — F419 Anxiety disorder, unspecified: Secondary | ICD-10-CM

## 2015-03-06 DIAGNOSIS — F4321 Adjustment disorder with depressed mood: Secondary | ICD-10-CM

## 2015-03-06 DIAGNOSIS — G4700 Insomnia, unspecified: Secondary | ICD-10-CM | POA: Diagnosis not present

## 2015-03-06 DIAGNOSIS — K219 Gastro-esophageal reflux disease without esophagitis: Secondary | ICD-10-CM | POA: Diagnosis not present

## 2015-03-06 DIAGNOSIS — R41840 Attention and concentration deficit: Secondary | ICD-10-CM

## 2015-03-06 MED ORDER — ALPRAZOLAM 1 MG PO TABS: ORAL_TABLET | ORAL | Status: DC

## 2015-03-06 MED ORDER — OMEPRAZOLE 40 MG PO CPDR: 40.0000 mg | DELAYED_RELEASE_CAPSULE | Freq: Every day | ORAL | Status: DC

## 2015-03-06 MED ORDER — AMPHETAMINE-DEXTROAMPHETAMINE 20 MG PO TABS: 20.0000 mg | ORAL_TABLET | Freq: Every day | ORAL | Status: DC

## 2015-03-06 MED ORDER — CITALOPRAM HYDROBROMIDE 40 MG PO TABS: 40.0000 mg | ORAL_TABLET | Freq: Every day | ORAL | Status: DC

## 2015-03-06 MED ORDER — VSL#3 PO CAPS: 1.0000 | ORAL_CAPSULE | Freq: Two times a day (BID) | ORAL | Status: DC

## 2015-03-06 MED ORDER — AMPHETAMINE-DEXTROAMPHETAMINE 10 MG PO TABS: 5.0000 mg | ORAL_TABLET | Freq: Every day | ORAL | Status: DC

## 2015-03-06 NOTE — Progress Notes (Signed)
Patient ID: Allison Miller, female    DOB: 19-Jun-1971, 44 y.o.   MRN: [id number]  PCP: No PCP Per Patient   Subjective:  HPI Pt is a 44 y/o female presenting to clinic for her annual exam.  Pt states that she noticed that she started feeling slightly "off" over the last 6 months. She feels like she has no interest in exercise or eating healthy (which used to be a daily occurrence for her), she sleeps more than she used to, she comes home from work and just wants to get in her pajamas and have a beer, and she feels more irritable and easily agitated. She has been in a relationship with her boyfriend, Waunita Schooner, for 11 years and just feels like it's a "relationship of convenience". She says that she is not happy in the relationship, but doesn't have the courage to end the relationship and leave. She says that he's a great guy and she feels safe at home, but she says there is minimal physical contact, he doesn't get along with her kids, they don't really talk about important things, and he talks about about his future as an "I" rather than a "we". Additionally, her kids moved out from her house last month to go live with their father after an argument she had with her son. She began seeing a therapist about 3-4 months ago, and feels like that has been very helpful. She feels that she is more accepting of her kids being gone. She states that they already want to come home, but she thinks it would be best if they stay with their father for the summer. She states that she doesn't want to take any additional medications because she doesn't like taking medications and she does not like the way that the medications made her feel when she previously tried them, however, she would be open for a discussion about them. Pt denies thoughts/feelings/plans of hurting herself or others.  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 She used to only take 1 pill of her Adderall when she went to work, but now she has started taking 2 pills each time. She states that she only uses it when she is at work, and takes it maybe 3-4 times a week. The increased dose is helping with her concentration. She did state that she has concern that Celexa could be playing a role in her memory decline. She was nervous about starting Celexa in the first place because of the possible side effects.   Pt needs a refill for adderall, xanax, probiotics, celexa, and prilosec. She had stopped taking prilosec because she didn't think she was having problems with reflux any longer, but mentioned that she has noticed that she sometimes experiences some reflux and would like to resume the medication. She tolerates all of these medications well and without side effects.  Pt sees Gynecologist regularly. She mentioned that they have done a work-up for possible hormone-related causes of her symptoms, and she was told that they would fax the results of those tests to [place].   Ophthlamology: Last seen 2 years ago, no abnormalities. Plans to see them this year now that she has eye insurance. Dental: Last seen a couple of weeks ago. Vaccines: UTD. Pap: Last done in May 2016 at [facility]. Pt reports  normal results. Mammo: Last done in May 2016. Pt reports normal results. HIV: Screened in 2011, negative result. Colonoscopy: Not yet a candidate. Given Hemooccult cards last Georges Mouse but has not yet used them.    Review of Systems  Constitutional: Positive for activity change (decreased desire to do activities) and fatigue. Negative for fever, chills, diaphoresis, appetite change and unexpected weight change.  HENT: Negative.   Eyes: Negative.   Respiratory: Negative.   Cardiovascular: Negative.   Gastrointestinal: Negative.   Endocrine: Negative.   Genitourinary:  Negative.   Musculoskeletal: Negative.   Skin: Negative.   Allergic/Immunologic: Negative.   Neurological: Negative.   Hematological: Negative.   Psychiatric/Behavioral: Positive for sleep disturbance (chronic insomnia, controlled with Xanax), dysphoric mood, decreased concentration (controlled with Adderall) and agitation. Negative for suicidal ideas, hallucinations, behavioral problems, confusion and self-injury. The patient is nervous/anxious. The patient is not hyperactive.      Patient Active Problem List   Diagnosis Date Noted  . Insomnia 08/17/2012  . Anxiety 08/17/2012  . 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 Anxiety   . Insomnia   . GERD (gastroesophageal reflux disease)     Prior to Admission medications   Medication Sig Start Date End Date Taking? Authorizing Provider  ALPRAZolam (XANAX) 1 MG tablet TAKE 1/2-1 TABLET BY MOUTH AT BEDTIME AS NEEDED FOR SLEEP OR ANXIETY. 01/02/15   Chelle Jeffery, PA-C  amphetamine-dextroamphetamine (ADDERALL) 10 MG tablet Take 0.5-1 tablets (5-10 mg total) by mouth 2 (two) times daily with a meal. 01/02/15   Chelle Jeffery, PA-C  citalopram (CELEXA) 40 MG tablet Take 1 tablet (40 mg total) by mouth daily. 01/02/15   Harrison Mons, PA-C  levonorgestrel (MIRENA) 20 MCG/24HR IUD 1 each by Intrauterine route once.    Historical Provider, MD  omeprazole (PRILOSEC) 40 MG capsule TAKE ONE CAPSULE EVERY DAY    Chelle Jeffery, PA-C  Probiotic Product (VSL#3) CAPS Take 1 capsule by mouth 2 (two) times daily. PATIENT NEEDS OFFICE VISIT FOR ADDITIONAL REFILLS    Chelle Jeffery, PA-C  valACYclovir (VALTREX) 500 MG tablet Take 1 tablet (500 mg total) by mouth 2 (two) times daily. 06/02/14   Harrison Mons, PA-C    Not on File  Past Medical, Surgical Family and Social History reviewed and updated.   Objective:   Vitals: BP 96/66 mmHg  Pulse 67  Temp(Src) 98.3 F (36.8 C) (Oral)  Resp 16  Ht 5' (1.524 m)  Wt 127 lb 6.4  oz (57.788 kg)  BMI 24.88 kg/m2   Physical Exam  Constitutional: She is oriented to person, place, and time. She appears well-developed and well-nourished. She is cooperative. No distress.  HENT:  Head: Normocephalic and atraumatic.  Right Ear: Hearing, tympanic membrane, external ear and ear canal normal.  Left Ear: Hearing, tympanic membrane, external ear and ear canal normal.  Nose: Nose normal. Right sinus exhibits no maxillary sinus tenderness and no frontal sinus tenderness. Left sinus exhibits no maxillary sinus tenderness and no frontal sinus tenderness.  Mouth/Throat: Uvula is midline and mucous membranes are normal. Posterior oropharyngeal erythema (mild) present. No oropharyngeal exudate, posterior oropharyngeal edema or tonsillar abscesses.  Eyes: Conjunctivae, EOM and lids are normal. Pupils are equal, round, and reactive to light.  Fundoscopic exam:      The right eye shows no papilledema. The right eye shows red reflex.       The left eye shows no papilledema. The left eye shows red reflex.  Neck:  Trachea normal, normal range of motion and phonation normal. Neck supple. No spinous process tenderness present. No thyroid mass and no thyromegaly present.  Cardiovascular: Normal rate, regular rhythm, normal heart sounds and intact distal pulses.  Exam reveals no gallop and no friction rub.   No murmur heard. Pulmonary/Chest: Effort normal and breath sounds normal. No respiratory distress. She has no wheezes. She has no rales.  Breast exam done at Ob/Gyn  Abdominal: Soft. Normal appearance and bowel sounds are normal. She exhibits no distension and no mass. There is no hepatosplenomegaly. There is no tenderness. There is no rebound and no guarding.  Genitourinary:  Pelvic exam done at Ob/Gyn  Lymphadenopathy:       Head (right side): No submental, no submandibular, no tonsillar, no preauricular, no posterior auricular and no occipital adenopathy present.       Head (left side): No  submental, no submandibular, no tonsillar, no preauricular, no posterior auricular and no occipital adenopathy present.    She has no cervical adenopathy.       Right: No supraclavicular adenopathy present.       Left: No supraclavicular adenopathy present.  Neurological: She is alert and oriented to person, place, and time. She has normal strength and normal reflexes. No sensory deficit.  Skin: Skin is warm, dry and intact. No rash noted. No cyanosis. Nails show no clubbing.  Psychiatric: She has a normal mood and affect. Her speech is normal and behavior is normal. Judgment and thought content normal.    Assessment & Plan:   Millissa Deese was seen today for annual exam.  Diagnoses and all orders for this visit:  Annual physical exam -    Reviewed and discussed age-appropriate health maintenance. -    No labs needed at this time, last done with results WNL in 05/2014. -    See below. Orders: -     Probiotic Product (VSL#3) CAPS; Take 1 capsule by mouth 2 (two) times daily.  Anxiety -    Controlled. Memory changes are likely not d/t the Celexa, but rather the increased stress and decreased attention. Continue current regimen. Orders: -     citalopram (CELEXA) 40 MG tablet; Take 1 tablet (40 mg total) by mouth daily.  Insomnia -     Controlled. Continue current regimen. Orders: -     ALPRAZolam (XANAX) 1 MG tablet; TAKE 1/2-1 TABLET BY MOUTH AT BEDTIME AS NEEDED FOR SLEEP OR ANXIETY.   Gastroesophageal reflux disease without esophagitis -     Pt has not been taking Prilosec, and has been experiencing some reflux. -     Plan to resume Prilosec. Orders: -     omeprazole (PRILOSEC) 40 MG capsule; Take 1 capsule (40 mg total) by mouth daily.  Inattention -     Pt has been taking two 10 mg tablets of Adderall at work, which has been working better for her. Will change her dose to 20mg  for her refill. -     Her perceived memory loss is likely d/t decreased concentration and increased  stress. Will see how she progresses as her other conditions improve with medication and therapy. Orders: -     amphetamine-dextroamphetamine (ADDERALL) 20 MG tablet; Take 1 tablet (20 mg total) by mouth daily with breakfast.  Adjustment disorder with depressed mood       -     Depressed mood likely d/t her current home life. She wants to make changes to her life and is currently working with a  therapist to make these changes happen.        -     Pt is already taking Celexa, which should help some too.        -     Discussed different options for new medications. Will plan to not add any medications at this time.        -     Pt should continue seeing the therapist.        -     We encouraged her to make the changes that she wants to make, and we will re-assess the need for medication in 3-6 months.    Eldrige Pitkin, PA-S [facility] 03/06/2015 10:14 AM

## 2015-03-06 NOTE — Patient Instructions (Signed)

## 2015-03-06 NOTE — Progress Notes (Addendum)
 "  Patient ID: Allison Miller, female    DOB: 04/07/1971, 44 y.o.   MRN: [id number]  PCP: No PCP Per Patient  Chief Complaint  Patient presents with   Annual Exam    NO PAP SMEAR    Subjective:   HPI: Presents for annual exam.   Pt states that she noticed that she started feeling slightly off over the last 6 months. She feels like she has no interest in exercise or eating healthy (which used to be a daily occurrence for her), she sleeps more than she used to, she comes home from work and just wants to get in her pajamas and have a beer, and she feels more irritable and easily agitated. She has been in a relationship with her boyfriend, Deatrice, for 11 years and just feels like it's a relationship of convenience. She says that she is not happy in the relationship, but doesn't have the courage to end the relationship and leave. She says that he's a great guy and she feels safe at home, but she says there is minimal physical contact, he doesn't get along with her kids, they don't really talk about important things, and he talks about about his future as an I rather than a we. Additionally, her kids moved out from her house last month to go live with their father after an argument she had with her son.   She began seeing a therapist about 3-4 months ago, and feels like that has been very helpful. She feels that she is more accepting of her kids being gone. She states that they already want to come home, but she thinks it would be best if they stay with their father for the summer. She states that she doesn't want to take any additional medications because she doesn't like taking medications and she does not like the way that the medications made her feel when she previously tried them, however, she would be open for a discussion about them. Pt denies thoughts/feelings/plans of hurting herself or others.   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 She used to only take 1 pill of her Adderall when she went to work, but now she has started taking 2 pills each time. She states that she only uses it when she is at work, and takes it maybe 3-4 times a week. The increased dose is helping with her concentration. She did state that she has concern that Celexa  could be playing a role in her memory decline. She was nervous about starting Celexa  in the first place because of the possible side effects.   Pt needs a refill for adderall, xanax , probiotics, celexa , and prilosec. She had stopped taking prilosec because she didn't think she was having problems with reflux any longer, but mentioned that she has noticed that she sometimes experiences some reflux and would like to resume the medication. She tolerates all of these medications well and without side effects.   Pt sees Gynecologist regularly. She mentioned that they have done a work-up for possible hormone-related causes of her symptoms, and she was told that they would fax the results of those tests to us .   Ophthlamology: Last seen 2 years ago, no abnormalities. Plans to see them this year now that she has eye insurance.  Dental: Last seen  a couple of weeks ago.  Vaccines: UTD.  Pap: Last done in May 2016 at Gynecologist. Pt reports normal results.  Mammo: Last done in May 2016. Pt reports normal results.  HIV: Screened in 2011, negative result.  Colonoscopy: Not yet a candidate. Given Hemooccult cards last Octobver but has not yet used them.      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 Authorizing Provider  ALPRAZolam  (XANAX ) 1 MG tablet TAKE 1/2-1 TABLET BY MOUTH AT BEDTIME AS NEEDED FOR SLEEP OR ANXIETY. 01/02/15  Yes Paighton Godette, PA-C  amphetamine -dextroamphetamine  (ADDERALL) 10 MG tablet Take 0.5-1 tablets (5-10 mg total) by mouth 2 (two) times daily with a meal. 01/02/15  Yes Mikahla Wisor, PA-C  amphetamine -dextroamphetamine  (ADDERALL) 10 MG tablet Take 0.5-1 tablets (5-10 mg total) by mouth 2 (two) times daily with a meal. 01/02/15  Yes Britteny Fiebelkorn, PA-C  amphetamine -dextroamphetamine  (ADDERALL) 10 MG tablet Take 0.5-1 tablets (5-10 mg total) by mouth 2 (two) times daily with a meal. 01/02/15  Yes Donelle Baba, PA-C  citalopram  (CELEXA ) 40 MG tablet Take 1 tablet (40 mg total) by mouth daily. 01/02/15  Yes Bernita Ned, PA-C  levonorgestrel (MIRENA) 20 MCG/24HR IUD 1 each by Intrauterine route once.   Yes Historical Provider, MD  Probiotic Product (VSL#3) CAPS Take 1 capsule by mouth 2 (two) times daily. PATIENT NEEDS OFFICE VISIT FOR ADDITIONAL REFILLS   Yes Quaron Delacruz, PA-C  valACYclovir  (VALTREX ) 500 MG tablet Take 1 tablet (500 mg total) by mouth 2 (two) times daily. 06/02/14  Yes Saja Bartolini, PA-C  ALPRAZolam  (XANAX ) 1 MG tablet TAKE 1/2-1 TABLET BY MOUTH AT BEDTIME AS NEEDED FOR SLEEP OR ANXIETY. 01/02/15   Locklyn Henriquez, PA-C  ALPRAZolam  (XANAX ) 1 MG tablet TAKE 1/2-1 TABLET BY MOUTH AT BEDTIME AS NEEDED FOR SLEEP OR ANXIETY. 01/02/15   Rodd Heft, PA-C  omeprazole  (PRILOSEC) 40 MG capsule TAKE ONE CAPSULE EVERY DAY Patient not taking: Reported on 03/06/2015    Bernita Ned, PA-C    Not on File  Past Surgical History  Procedure Laterality Date   Tonsillectomy  1998    Family History  Problem Relation Age of Onset   Hypertension Mother    Hypertension Sister    Healthy Father    Stroke Maternal Grandfather    Stroke Paternal Grandfather     History   Social History   Marital Status:  Single    Spouse Name: n/a   Number of Children: N/A   Years of Education: N/A   Occupational History   speech therapist    Social History Main Topics   Smoking status: Never Smoker    Smokeless tobacco: Not on file   Alcohol Use: 0.0 oz/week    0 Standard drinks or equivalent per week     Comment: per patient 4 drinks a week   Drug Use: No   Sexual Activity: Not on file   Other Topics Concern   None   Social History Narrative   Divorced. Lives with long-time boyfriend. Children moved to live with their father summer 2016.       Review of Systems Constitutional: Positive  for activity change (decreased desire to do activities) and fatigue. Negative for fever, chills, diaphoresis, appetite change and unexpected weight change.  HENT: Negative.  Eyes: Negative.  Respiratory: Negative.  Cardiovascular: Negative.  Gastrointestinal: Negative.  Endocrine: Negative.  Genitourinary: Negative.  Musculoskeletal: Negative.  Skin: Negative.  Allergic/Immunologic: Negative.  Neurological: Negative.  Hematological: Negative.  Psychiatric/Behavioral: Positive for sleep disturbance (chronic insomnia, controlled with Xanax ), dysphoric mood, decreased concentration (controlled with Adderall) and agitation. Negative for suicidal ideas, hallucinations, behavioral problems, confusion and self-injury. The patient is nervous/anxious. The patient is not hyperactive.          Objective:  Physical Exam  Constitutional: She is oriented to person, place, and time. She appears well-developed and well-nourished. No distress.  BP 96/66 mmHg  Pulse 67  Temp(Src) 98.3 F (36.8 C) (Oral)  Resp 16  Ht 5' (1.524 m)  Wt 127 lb 6.4 oz (57.788 kg)  BMI 24.88 kg/m2   Eyes: Conjunctivae are normal. No scleral icterus.  Neck: No thyromegaly present.  Cardiovascular: Normal rate, regular rhythm, normal heart sounds and intact distal pulses.   Pulmonary/Chest: Effort normal and breath sounds normal.   Lymphadenopathy:    She has no cervical adenopathy.  Neurological: She is alert and oriented to person, place, and time.  Skin: Skin is warm and dry.  Psychiatric: She has a normal mood and affect. Her behavior is normal.           Assessment & Plan:  1. Annual physical exam Age appropriate anticipatory guidance provided. - Probiotic Product (VSL#3) CAPS; Take 1 capsule by mouth 2 (two) times daily.  Dispense: 180 capsule; Refill: 3  2. Anxiety Controlled. Continue citalopram . - citalopram  (CELEXA ) 40 MG tablet; Take 1 tablet (40 mg total) by mouth daily.  Dispense: 90 tablet; Refill: 3  3. Insomnia Controlled/stable. Continue HS alprazolam . - ALPRAZolam  (XANAX ) 1 MG tablet; TAKE 1/2-1 TABLET BY MOUTH AT BEDTIME AS NEEDED FOR SLEEP OR ANXIETY.  Dispense: 30 tablet; Refill: 0 - ALPRAZolam  (XANAX ) 1 MG tablet; TAKE 1/2-1 TABLET BY MOUTH AT BEDTIME AS NEEDED FOR SLEEP OR ANXIETY.  Dispense: 30 tablet; Refill: 0 - ALPRAZolam  (XANAX ) 1 MG tablet; TAKE 1/2-1 TABLET BY MOUTH AT BEDTIME AS NEEDED FOR SLEEP OR ANXIETY.  Dispense: 30 tablet; Refill: 0  4. Gastroesophageal reflux disease without esophagitis Restart PPI therapy. If reflux symptoms persist, may need additional evaluation. - omeprazole  (PRILOSEC) 40 MG capsule; Take 1 capsule (40 mg total) by mouth daily.  Dispense: 30 capsule; Refill: 4  5. Inattention Improved with higher dose of Adderall. - amphetamine -dextroamphetamine  (ADDERALL) 20 MG tablet; Take 1 tablet (20 mg total) by mouth daily with breakfast.  Dispense: 30 tablet; Refill: 0 - amphetamine -dextroamphetamine  (ADDERALL) 20 MG tablet; Take 1 tablet (20 mg total) by mouth daily with breakfast.  Dispense: 30 tablet; Refill: 0 - amphetamine -dextroamphetamine  (ADDERALL) 10 MG tablet; Take 0.5-1 tablets (5-10 mg total) by mouth daily with breakfast.  Dispense: 30 tablet; Refill: 0   Bernita CANDIE Ned, PA-C Physician Assistant-Certified [facility]  "

## 2015-03-11 ENCOUNTER — Encounter: Payer: Self-pay | Admitting: Physician Assistant

## 2015-03-11 DIAGNOSIS — L811 Chloasma: Secondary | ICD-10-CM | POA: Insufficient documentation

## 2015-03-29 ENCOUNTER — Telehealth: Payer: Self-pay | Admitting: Family Medicine

## 2015-03-29 NOTE — Telephone Encounter (Signed)
lmom to call and reschedule appt with chelle for a ov visit in Nov

## 2015-03-30 ENCOUNTER — Other Ambulatory Visit: Payer: Self-pay | Admitting: Physician Assistant

## 2015-03-31 NOTE — Telephone Encounter (Signed)
Rx faxed

## 2015-03-31 NOTE — Telephone Encounter (Signed)
Meds ordered this encounter  Medications  . ALPRAZolam (XANAX) 1 MG tablet    Sig: TAKE 1/2-1 TABLET BY MOUTH AT BEDTIME AS NEEDED FOR SLEEP OR ANXIETY    Dispense:  30 tablet    Refill:  0    Not to exceed 5 additional fills before 07/01/2015

## 2015-05-29 ENCOUNTER — Ambulatory Visit: Payer: BLUE CROSS/BLUE SHIELD | Admitting: Physician Assistant

## 2015-06-04 ENCOUNTER — Other Ambulatory Visit: Payer: Self-pay | Admitting: Physician Assistant

## 2015-06-05 NOTE — Telephone Encounter (Signed)
Called in.

## 2015-07-03 ENCOUNTER — Ambulatory Visit: Payer: BLUE CROSS/BLUE SHIELD | Admitting: Physician Assistant

## 2015-07-03 ENCOUNTER — Other Ambulatory Visit: Payer: Self-pay | Admitting: Physician Assistant

## 2015-07-04 NOTE — Telephone Encounter (Signed)
Faxed

## 2015-07-04 NOTE — Telephone Encounter (Signed)
Meds ordered this encounter  Medications  . ALPRAZolam (XANAX) 1 MG tablet    Sig: TAKE 1/2 TO 1 TABLET AT BEDTIME AS NEEDED FOR ANXIETY OR SLEEP    Dispense:  30 tablet    Refill:  0    Not to exceed 5 additional fills before 12/02/2015

## 2015-07-18 ENCOUNTER — Ambulatory Visit (INDEPENDENT_AMBULATORY_CARE_PROVIDER_SITE_OTHER): Payer: BLUE CROSS/BLUE SHIELD | Admitting: Physician Assistant

## 2015-07-18 VITALS — BP 118/62 | HR 67 | Temp 98.9°F | Resp 16 | Ht 60.0 in | Wt 129.0 lb

## 2015-07-18 DIAGNOSIS — G4700 Insomnia, unspecified: Secondary | ICD-10-CM | POA: Diagnosis not present

## 2015-07-18 DIAGNOSIS — R41840 Attention and concentration deficit: Secondary | ICD-10-CM

## 2015-07-18 DIAGNOSIS — F419 Anxiety disorder, unspecified: Secondary | ICD-10-CM

## 2015-07-18 MED ORDER — METHYLPHENIDATE HCL ER 20 MG PO TBCR: 20.0000 mg | EXTENDED_RELEASE_TABLET | Freq: Every day | ORAL | Status: DC

## 2015-07-18 MED ORDER — ALPRAZOLAM 1 MG PO TABS: ORAL_TABLET | ORAL | Status: DC

## 2015-07-18 MED ORDER — TRAZODONE HCL 50 MG PO TABS: 25.0000 mg | ORAL_TABLET | Freq: Every evening | ORAL | Status: DC | PRN

## 2015-07-18 NOTE — Progress Notes (Signed)
Patient ID: Allison Miller, female    DOB: 31-Mar-1971, 44 y.o.   MRN: [id number]  PCP: No PCP Per Patient  Subjective:   Chief Complaint  Patient presents with  . Medication review    HPI Presents for medication review.  Wants to talk about Adderall. A friend takes another product (Metadate ER 20 mg) that really helped and didn't cause so much jitteriness. She has tried Adderall XR, but still felt "like a crazed lunatic running down the halls. I have a hard time slowing down." Without it, she feels irritable, easily distracted and has great difficulty concentrating.  Alprazolam. She is exhausted, physically and intellectually, but still can't sleep. Brain races, thinking about everything. Tried taking benadryl once after a bug bite and felt completely "drunk."  After I saw her in July, she started exercising regularly, and overdid it. She injured her back and hip. Evaluation at [facility] revealed L5-S1 spondylolisthesis. Had an injection without any improvement. Was in PT for 8 weeks, which did help a lot, and she continues the HEP. She's back at the gym, but less intensive. Unable to afford follow-up (intial visit with ortho was >$400).     Review of Systems  As above.   Patient Active Problem List   Diagnosis Date Noted  . Melasma 03/11/2015  . GERD (gastroesophageal reflux disease) 03/06/2015  . Insomnia 08/17/2012  . Anxiety 08/17/2012  . 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 Authorizing Provider  ALPRAZolam (XANAX) 1 MG tablet TAKE 1/2-1 TABLET BY MOUTH AT BEDTIME AS NEEDED FOR SLEEP OR ANXIETY. 03/06/15  Yes Sumeya Yontz, PA-C  amphetamine-dextroamphetamine (ADDERALL) 20 MG tablet Take 1 tablet (20 mg total) by mouth daily with breakfast. 03/06/15  Yes Nilan Iddings, PA-C  citalopram (CELEXA) 40 MG tablet Take 1 tablet (40 mg total) by mouth daily. 03/06/15  Yes Obie Silos, PA-C    levonorgestrel (MIRENA) 20 MCG/24HR IUD 1 each by Intrauterine route once.   Yes Historical Provider, MD  omeprazole (PRILOSEC) 40 MG capsule Take 1 capsule (40 mg total) by mouth daily. 03/06/15  Yes Jalie Eiland, PA-C  Probiotic Product (VSL#3) CAPS Take 1 capsule by mouth 2 (two) times daily. 03/06/15  Yes London Nonaka, PA-C  valACYclovir (VALTREX) 500 MG tablet Take 1 tablet (500 mg total) by mouth 2 (two) times daily. 06/02/14  Yes Dari Carpenito, PA-C     No Known Allergies     Objective:  Physical Exam  Constitutional: She is oriented to person, place, and time. She appears well-developed and well-nourished. No distress.  BP 118/62 mmHg  Pulse 67  Temp(Src) 98.9 F (37.2 C) (Oral)  Resp 16  Ht 5' (1.524 m)  Wt 129 lb (58.514 kg)  BMI 25.19 kg/m2  SpO2 98%   Eyes: Conjunctivae are normal. No scleral icterus.  Neck: Neck supple. No thyromegaly present.  Cardiovascular: Normal rate, regular rhythm, normal heart sounds and intact distal pulses.   Pulmonary/Chest: Effort normal and breath sounds normal.  Lymphadenopathy:    She has no cervical adenopathy.  Neurological: She is alert and oriented to person, place, and time.  Skin: Skin is warm and dry.  Psychiatric: She has a normal mood and affect. Her speech is normal and behavior is normal.           Assessment & Plan:   1. Insomnia Trial of trazodone in hopes that she can reduce the alprazolam. - ALPRAZolam (  XANAX) 1 MG tablet; TAKE 1/2-1 TABLET BY MOUTH AT BEDTIME AS NEEDED FOR SLEEP OR ANXIETY.  Dispense: 30 tablet; Refill: 0 - ALPRAZolam (XANAX) 1 MG tablet; TAKE 1/2-1 TABLET BY MOUTH AT BEDTIME AS NEEDED FOR SLEEP OR ANXIETY.  Dispense: 30 tablet; Refill: 0 - ALPRAZolam (XANAX) 1 MG tablet; TAKE 1/2-1 TABLET BY MOUTH AT BEDTIME AS NEEDED FOR SLEEP OR ANXIETY  Dispense: 30 tablet; Refill: 0 - traZODone (DESYREL) 50 MG tablet; Take 0.5-2 tablets (25-100 mg total) by mouth at bedtime as needed for sleep.   Dispense: 90 tablet; Refill: 2  2. Anxiety Well controlled except at HS. May be exacerbated by the Adderall. Change to Metadate, as below.  3. Inattention Seems too jittery on Adderall. Trial of Metadate. - methylphenidate (METADATE ER) 20 MG ER tablet; Take 1 tablet (20 mg total) by mouth daily.  Dispense: 30 tablet; Refill: 0 - methylphenidate (METADATE ER) 20 MG ER tablet; Take 1 tablet (20 mg total) by mouth daily.  Dispense: 30 tablet; Refill: 0 - methylphenidate (METADATE ER) 20 MG ER tablet; Take 1 tablet (20 mg total) by mouth daily.  Dispense: 30 tablet; Refill: 0   Fara Chute, PA-C Physician Assistant-Certified [facility]

## 2015-08-06 ENCOUNTER — Encounter: Payer: Self-pay | Admitting: Physician Assistant

## 2015-08-11 ENCOUNTER — Other Ambulatory Visit: Payer: Self-pay | Admitting: Physician Assistant

## 2015-09-30 ENCOUNTER — Encounter: Payer: Self-pay | Admitting: Physician Assistant

## 2015-10-14 ENCOUNTER — Encounter: Payer: Self-pay | Admitting: Physician Assistant

## 2015-10-14 ENCOUNTER — Ambulatory Visit: Payer: BLUE CROSS/BLUE SHIELD | Admitting: Physician Assistant

## 2015-10-14 MED ORDER — METHYLPHENIDATE HCL ER 36 MG PO TB24: 36.0000 mg | ORAL_TABLET | Freq: Every day | ORAL | Status: DC

## 2015-10-14 NOTE — Telephone Encounter (Signed)
Patient notified via My Chart.  Rx printed at 104. Will bring to 102 after clinic.  Meds ordered this encounter  Medications  . methylphenidate 36 MG PO CR tablet    Sig: Take 1 tablet (36 mg total) by mouth daily.    Dispense:  30 tablet    Refill:  0    Order Specific Question:  Supervising Provider    Answer:  DOOLITTLE, ROBERT P [id number]

## 2015-10-15 NOTE — Telephone Encounter (Signed)
Rx in drawer. Pt aware ready. 

## 2015-11-06 ENCOUNTER — Other Ambulatory Visit: Payer: Self-pay | Admitting: Physician Assistant

## 2016-02-03 ENCOUNTER — Other Ambulatory Visit: Payer: Self-pay | Admitting: Obstetrics and Gynecology

## 2016-02-03 DIAGNOSIS — R928 Other abnormal and inconclusive findings on diagnostic imaging of breast: Secondary | ICD-10-CM

## 2016-02-09 ENCOUNTER — Ambulatory Visit
Admission: RE | Admit: 2016-02-09 | Discharge: 2016-02-09 | Disposition: A | Discharge status: Home / Self Care | Payer: BLUE CROSS/BLUE SHIELD | Source: Ambulatory Visit | Attending: Obstetrics and Gynecology | Admitting: Obstetrics and Gynecology

## 2016-02-09 DIAGNOSIS — R928 Other abnormal and inconclusive findings on diagnostic imaging of breast: Secondary | ICD-10-CM

## 2016-03-11 ENCOUNTER — Telehealth: Payer: Self-pay

## 2016-03-11 NOTE — Telephone Encounter (Signed)
chelle  methylphenidate 36 MG PO CR tablet Request for refill - she did not have time to make an appointment and is hoping you will refill for her w/o an appointment  [phone]

## 2016-03-12 MED ORDER — METHYLPHENIDATE HCL ER 36 MG PO TB24: 36.0000 mg | ORAL_TABLET | Freq: Every day | ORAL | Status: DC

## 2016-03-12 NOTE — Telephone Encounter (Signed)
Routed to [employer]

## 2016-03-12 NOTE — Telephone Encounter (Signed)
Meds ordered this encounter  Medications  . methylphenidate 36 MG PO CR tablet    Sig: Take 1 tablet (36 mg total) by mouth daily.    Dispense:  30 tablet    Refill:  0    Order Specific Question:  Supervising Provider    Answer:  Brigitte Pulse, EVA N [[id number]]

## 2016-03-29 ENCOUNTER — Other Ambulatory Visit: Payer: Self-pay | Admitting: Physician Assistant

## 2016-03-29 DIAGNOSIS — K219 Gastro-esophageal reflux disease without esophagitis: Secondary | ICD-10-CM

## 2016-03-29 DIAGNOSIS — F419 Anxiety disorder, unspecified: Secondary | ICD-10-CM

## 2016-04-07 ENCOUNTER — Telehealth: Payer: Self-pay

## 2016-04-07 NOTE — Telephone Encounter (Signed)
Patient needs her citalopram (CELEXA) 40 MG tablet refilled and states she is in between insurances right now she is suppose to get her new card next week and she has set up an appointment with [facility] for the 15th but she needs enough of this medication to last her until then can we see about getting her something called in.   She uses the [id number] - [place], [place].  Her call back number is  [phone]

## 2016-04-08 ENCOUNTER — Other Ambulatory Visit: Payer: Self-pay

## 2016-04-08 DIAGNOSIS — F419 Anxiety disorder, unspecified: Secondary | ICD-10-CM

## 2016-04-08 MED ORDER — CITALOPRAM HYDROBROMIDE 40 MG PO TABS: 40.0000 mg | ORAL_TABLET | Freq: Every day | ORAL | 0 refills | Status: DC

## 2016-04-09 NOTE — Telephone Encounter (Signed)
LAst OV 07/2015. Can we refill?

## 2016-04-09 NOTE — Telephone Encounter (Signed)
We can refill for 1 month.  I can not get to the future order, so please prescribe, and alert patient. thanks

## 2016-04-13 ENCOUNTER — Encounter: Payer: Self-pay | Admitting: Physician Assistant

## 2016-04-13 ENCOUNTER — Ambulatory Visit (INDEPENDENT_AMBULATORY_CARE_PROVIDER_SITE_OTHER): Payer: BLUE CROSS/BLUE SHIELD | Admitting: Physician Assistant

## 2016-04-13 VITALS — BP 100/64 | HR 69 | Temp 98.8°F | Resp 16 | Ht 60.75 in | Wt 131.8 lb

## 2016-04-13 DIAGNOSIS — F419 Anxiety disorder, unspecified: Secondary | ICD-10-CM | POA: Diagnosis not present

## 2016-04-13 DIAGNOSIS — R41840 Attention and concentration deficit: Secondary | ICD-10-CM

## 2016-04-13 DIAGNOSIS — K219 Gastro-esophageal reflux disease without esophagitis: Secondary | ICD-10-CM

## 2016-04-13 MED ORDER — METHYLPHENIDATE HCL 10 MG PO TABS: 10.0000 mg | ORAL_TABLET | Freq: Two times a day (BID) | ORAL | 0 refills | Status: DC | PRN

## 2016-04-13 MED ORDER — OMEPRAZOLE 40 MG PO CPDR: 40.0000 mg | DELAYED_RELEASE_CAPSULE | Freq: Every day | ORAL | 3 refills | Status: AC

## 2016-04-13 MED ORDER — CITALOPRAM HYDROBROMIDE 40 MG PO TABS: 40.0000 mg | ORAL_TABLET | Freq: Every day | ORAL | 3 refills | Status: DC

## 2016-04-13 NOTE — Patient Instructions (Addendum)
Keep up the great work!     IF you received an x-ray today, you will receive an invoice from [facility]. Please contact [facility] at [phone] with questions or concerns regarding your invoice.   IF you received labwork today, you will receive an invoice from [employer]. Please contact [facility] at [phone] with questions or concerns regarding your invoice.   Our billing staff will not be able to assist you with questions regarding bills from these companies.  You will be contacted with the lab results as soon as they are available. The fastest way to get your results is to activate your My Chart account. Instructions are located on the last page of this paperwork. If you have not heard from [place] regarding the results in 2 weeks, please contact this office.

## 2016-04-13 NOTE — Progress Notes (Signed)
Subjective:    Patient ID: Allison Miller, female    DOB: 10-09-1970, 45 y.o.   MRN: [id number]  HPI  Patient presents for medication refills. Doing well on current dose of Celexa, feels like anxiety is stable. Gets dizzy if she forgets a dose, no other side effects. Also needs a refill of Prilosec for GERD. She is not taking the prilosec as often, but would like a prescription for it to have on hand.  She does not need a refill of methylphenidate, but wants to discuss maybe switching from an extended release to an immediate release. She does not take it daily, just when she needs it, but has noticed that it sometimes keeps her from being able to sleep. The only side effect she feels from this medication is jitters for the first 15 min after taking it.   Review of Systems  Constitutional: Positive for fatigue. Negative for appetite change and unexpected weight change.  Respiratory: Negative for shortness of breath.   Cardiovascular: Negative for chest pain and palpitations.  Gastrointestinal: Positive for constipation. Negative for abdominal pain.  Genitourinary: Negative for difficulty urinating.  Neurological: Negative for dizziness and headaches.  All other systems reviewed and are negative.    Current Outpatient Prescriptions:  .  citalopram (CELEXA) 40 MG tablet, Take 1 tablet (40 mg total) by mouth daily., Disp: 90 tablet, Rfl: 0 .  levonorgestrel (MIRENA) 20 MCG/24HR IUD, 1 each by Intrauterine route once., Disp: , Rfl:  .  methylphenidate 36 MG PO CR tablet, Take 1 tablet (36 mg total) by mouth daily., Disp: 30 tablet, Rfl: 0 .  omeprazole (PRILOSEC) 40 MG capsule, Take 1 capsule (40 mg total) by mouth daily., Disp: 30 capsule, Rfl: 4 .  Probiotic Product (VSL#3) CAPS, Take 1 capsule by mouth 2 (two) times daily., Disp: 180 capsule, Rfl: 3 .  valACYclovir (VALTREX) 500 MG tablet, TAKE ONE CAPLET BY MOUTH TWICE DAILY, Disp: 180 tablet, Rfl: 2   Allergies  Allergen  Reactions  . Trazodone And Nefazodone Other (See Comments)    dizziness blurred vision difficulty concentrating and overall poor feeling.      Objective:   Physical Exam  Constitutional: She is oriented to person, place, and time. She appears well-developed and well-nourished. She is cooperative. No distress.  Blood pressure 100/64, pulse 69, temperature 98.8 F (37.1 C), temperature source Oral, resp. rate 16, height 5' 0.75" (1.543 m), weight 131 lb 12.8 oz (59.8 kg), last menstrual period 03/30/2016, SpO2 98 %.  HENT:  Head: Normocephalic and atraumatic.  Eyes: Conjunctivae and lids are normal. No scleral icterus.  Neck: Neck supple.  Cardiovascular: Normal rate, regular rhythm and normal heart sounds.   Pulmonary/Chest: Effort normal and breath sounds normal.  Musculoskeletal: Normal range of motion.  Neurological: She is alert and oriented to person, place, and time.  Skin: Skin is warm, dry and intact. She is not diaphoretic. No pallor.  Psychiatric: She has a normal mood and affect. Her speech is normal and behavior is normal. Judgment and thought content normal. Cognition and memory are normal.        Assessment & Plan:  1. Gastroesophageal reflux disease without esophagitis - omeprazole (PRILOSEC) 40 MG capsule; Take 1 capsule (40 mg total) by mouth daily.  Dispense: 90 capsule; Refill: 3  2. Inattention - Discussed with patient that she can take 2x per day and that she can figure out how to dose so that she is able to focus but still fall  asleep at night. - methylphenidate (RITALIN) 10 MG tablet; Take 1 tablet (10 mg total) by mouth 2 (two) times daily as needed.  Dispense: 60 tablet; Refill: 0  3. Anxiety - stable with current therapy - citalopram (CELEXA) 40 MG tablet; Take 1 tablet (40 mg total) by mouth daily.  Dispense: 90 tablet; Refill: 3  Mareesa Gathright Rayburn PA-S 04/13/16

## 2016-04-13 NOTE — Progress Notes (Signed)
Patient ID: Allison Miller, female    DOB: April 30, 1971, 45 y.o.   MRN: [id number]  PCP: Harrison Mons, PA-C  Subjective:   Chief Complaint  Patient presents with  . Medication Refill    Citalopram 40 mg    HPI Presents for evaluation of anxiety and ADD.  Pleased with the current effect of citalopram. Anxiety feels well controlled. She notes dizziness if she misses a dose.  Notes that the extended release methylphenidate lasts too long if she takes it later than usual. Is interested in switching to the immediate release to reduce difficulty falling asleep. She also notes jittery feelings for about 15 minutes after taking the dose. Otherwise, she's tolerating it well.  Would like a refill of Prilosec, though she is only using it PRN now.  Depression screen [facility] 2/9 04/13/2016 07/18/2015 03/06/2015  Decreased Interest 0 0 0  Down, Depressed, Hopeless 0 0 0  PHQ - 2 Score 0 0 0     Review of Systems Constitutional: Positive for fatigue. Negative for appetite change and unexpected weight change.  Respiratory: Negative for shortness of breath.   Cardiovascular: Negative for chest pain and palpitations.  Gastrointestinal: Positive for constipation. Negative for abdominal pain.  Genitourinary: Negative for difficulty urinating.  Neurological: Negative for dizziness and headaches.  All other systems reviewed and are negative.    Patient Active Problem List   Diagnosis Date Noted  . Melasma 03/11/2015  . GERD (gastroesophageal reflux disease) 03/06/2015  . Insomnia 08/17/2012  . Anxiety 08/17/2012  . 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 Authorizing Provider  citalopram (CELEXA) 40 MG tablet Take 1 tablet (40 mg total) by mouth daily. 04/08/16  Yes Evon Lopezperez, PA-C  levonorgestrel (MIRENA) 20 MCG/24HR IUD 1 each by Intrauterine route once.   Yes Historical Provider, MD  methylphenidate 36 MG PO CR tablet  Take 1 tablet (36 mg total) by mouth daily. 03/12/16  Yes Malissa Slay, PA-C  omeprazole (PRILOSEC) 40 MG capsule Take 1 capsule (40 mg total) by mouth daily. 03/06/15  Yes Latarra Eagleton, PA-C  Probiotic Product (VSL#3) CAPS Take 1 capsule by mouth 2 (two) times daily. 03/06/15  Yes Nimai Burbach, PA-C  valACYclovir (VALTREX) 500 MG tablet TAKE ONE CAPLET BY MOUTH TWICE DAILY 11/07/15  Yes Sheamus Hasting, PA-C     Allergies  Allergen Reactions  . Trazodone And Nefazodone Other (See Comments)    dizziness blurred vision difficulty concentrating and overall poor feeling.       Objective:  Physical Exam  Constitutional: She is oriented to person, place, and time. She appears well-developed and well-nourished. She is active and cooperative. No distress.  BP 100/64 (BP Location: Right Arm, Patient Position: Sitting, Cuff Size: Normal)   Pulse 69   Temp 98.8 F (37.1 C) (Oral)   Resp 16   Ht 5' 0.75" (1.543 m)   Wt 131 lb 12.8 oz (59.8 kg)   LMP 03/30/2016   SpO2 98%   BMI 25.11 kg/m   HENT:  Head: Normocephalic and atraumatic.  Right Ear: Hearing normal.  Left Ear: Hearing normal.  Eyes: Conjunctivae are normal. No scleral icterus.  Neck: Normal range of motion. Neck supple. No thyromegaly present.  Cardiovascular: Normal rate, regular rhythm and normal heart sounds.   Pulses:      Radial pulses are 2+ on the right side, and 2+ on the left side.  Pulmonary/Chest: Effort normal and  breath sounds normal.  Lymphadenopathy:       Head (right side): No tonsillar, no preauricular, no posterior auricular and no occipital adenopathy present.       Head (left side): No tonsillar, no preauricular, no posterior auricular and no occipital adenopathy present.    She has no cervical adenopathy.       Right: No supraclavicular adenopathy present.       Left: No supraclavicular adenopathy present.  Neurological: She is alert and oriented to person, place, and time. No sensory deficit.  Skin:  Skin is warm, dry and intact. No rash noted. No cyanosis or erythema. Nails show no clubbing.  Psychiatric: She has a normal mood and affect. Her speech is normal and behavior is normal.           Assessment & Plan:   1. Gastroesophageal reflux disease without esophagitis Controlled. Stable. Continue current treatment. - omeprazole (PRILOSEC) 40 MG capsule; Take 1 capsule (40 mg total) by mouth daily.  Dispense: 90 capsule; Refill: 3  2. Inattention Trial of immediate release product. - methylphenidate (RITALIN) 10 MG tablet; Take 1 tablet (10 mg total) by mouth 2 (two) times daily as needed.  Dispense: 60 tablet; Refill: 0  3. Anxiety Controlled. Stable. Continue current treatment. - citalopram (CELEXA) 40 MG tablet; Take 1 tablet (40 mg total) by mouth daily.  Dispense: 90 tablet; Refill: 3   Return in about 3 months (around 07/14/2016).    Fara Chute, PA-C Physician Assistant-Certified [facility]

## 2016-07-13 ENCOUNTER — Encounter: Payer: BLUE CROSS/BLUE SHIELD | Admitting: Physician Assistant

## 2016-07-17 ENCOUNTER — Ambulatory Visit (INDEPENDENT_AMBULATORY_CARE_PROVIDER_SITE_OTHER): Payer: Managed Care, Other (non HMO) | Admitting: Physician Assistant

## 2016-07-17 VITALS — BP 90/66 | HR 70 | Temp 98.6°F | Resp 16 | Ht 61.0 in | Wt 136.0 lb

## 2016-07-17 DIAGNOSIS — Z0000 Encounter for general adult medical examination without abnormal findings: Secondary | ICD-10-CM | POA: Diagnosis not present

## 2016-07-17 DIAGNOSIS — Z1389 Encounter for screening for other disorder: Secondary | ICD-10-CM

## 2016-07-17 DIAGNOSIS — R635 Abnormal weight gain: Secondary | ICD-10-CM | POA: Diagnosis not present

## 2016-07-17 DIAGNOSIS — K5900 Constipation, unspecified: Secondary | ICD-10-CM

## 2016-07-17 DIAGNOSIS — Z13220 Encounter for screening for lipoid disorders: Secondary | ICD-10-CM

## 2016-07-17 DIAGNOSIS — K219 Gastro-esophageal reflux disease without esophagitis: Secondary | ICD-10-CM | POA: Diagnosis not present

## 2016-07-17 LAB — COMPREHENSIVE METABOLIC PANEL
ALK PHOS: 41 U/L (ref 33–115)
ALT: 29 U/L (ref 6–29)
AST: 27 U/L (ref 10–30)
Albumin: 4.6 g/dL (ref 3.6–5.1)
BILIRUBIN TOTAL: 0.6 mg/dL (ref 0.2–1.2)
BUN: 17 mg/dL (ref 7–25)
CO2: 21 mmol/L (ref 20–31)
CREATININE: 0.84 mg/dL (ref 0.50–1.10)
Calcium: 9.3 mg/dL (ref 8.6–10.2)
Chloride: 102 mmol/L (ref 98–110)
Glucose, Bld: 82 mg/dL (ref 65–99)
POTASSIUM: 4.3 mmol/L (ref 3.5–5.3)
Sodium: 138 mmol/L (ref 135–146)
TOTAL PROTEIN: 7.3 g/dL (ref 6.1–8.1)

## 2016-07-17 LAB — CBC WITH DIFFERENTIAL/PLATELET
BASOS PCT: 0 %
Basophils Absolute: 0 cells/uL (ref 0–200)
EOS ABS: 92 {cells}/uL (ref 15–500)
Eosinophils Relative: 2 %
HCT: 43.1 % (ref 35.0–45.0)
Hemoglobin: 14.8 g/dL (ref 11.7–15.5)
Lymphocytes Relative: 37 %
Lymphs Abs: 1702 cells/uL (ref 850–3900)
MCH: 30.6 pg (ref 27.0–33.0)
MCHC: 34.3 g/dL (ref 32.0–36.0)
MCV: 89 fL (ref 80.0–100.0)
MONO ABS: 276 {cells}/uL (ref 200–950)
MONOS PCT: 6 %
MPV: 9.4 fL (ref 7.5–12.5)
NEUTROS ABS: 2530 {cells}/uL (ref 1500–7800)
Neutrophils Relative %: 55 %
PLATELETS: 225 10*3/uL (ref 140–400)
RBC: 4.84 MIL/uL (ref 3.80–5.10)
RDW: 12.6 % (ref 11.0–15.0)
WBC: 4.6 10*3/uL (ref 3.8–10.8)

## 2016-07-17 LAB — POC MICROSCOPIC URINALYSIS (UMFC)

## 2016-07-17 LAB — POCT URINALYSIS DIP (MANUAL ENTRY)
Bilirubin, UA: NEGATIVE
Blood, UA: NEGATIVE
GLUCOSE UA: NEGATIVE
Ketones, POC UA: NEGATIVE
LEUKOCYTES UA: NEGATIVE
NITRITE UA: NEGATIVE
Protein Ur, POC: NEGATIVE
Spec Grav, UA: 1.015
UROBILINOGEN UA: 0.2
pH, UA: 7

## 2016-07-17 LAB — LIPID PANEL
CHOL/HDL RATIO: 3 ratio (ref <5.0)
CHOLESTEROL: 186 mg/dL (ref <200)
HDL: 63 mg/dL (ref >50)
LDL Cholesterol: 106 mg/dL — ABNORMAL HIGH (ref <100)
Triglycerides: 85 mg/dL (ref <150)
VLDL: 17 mg/dL (ref <30)

## 2016-07-17 NOTE — Progress Notes (Signed)
Subjective:    Patient ID: Allison Miller, female    DOB: 12-05-1970, 45 y.o.   MRN: [id number]  Patient Care Team: Harrison Mons, PA-C as PCP - General (Family Medicine)  Chief Complaint  Patient presents with  . Annual Exam    pt has already had pap x 3 months ago   Cervical cancer screening: 05/29/2016 Mammography: 6 months ago HIV Screening: 09/09/2009 Seasonal Influenza Vaccination: 05/2016 Td/Tdap Vaccination: 08/31/2011 Frequency of Dental evaluation: dentist every 6 months  Frequency of Eye evaluation: N/A  HPI: Presents for annual wellness exam. Patient with PMH of GERD, insomnia, anxiety, and ADD. Currently taking 40 mg Citalopram daily for anxiety, currently well-controlled. Taking 10 mg Methylphenidate (Concerta) daily for ADD, and Omeprazole as needed for her reflux symptoms.  Primary complaint today is constipation. States she normally has a bowel movement every 2-3 days but recently has only been going 1x/week for the past month. She has been taking different combinations of Miralax, Doculax, Colace, and Milk of Magnesia at home but states she only starts taking it "once I'm already backed up". She had a bowel movement 5 days ago and then started taking the Miralax daily and had another bowel movement yesterday. However, states she does not feel like she "completely emptied" after a bowel movement. She states her diet has not been as healthy as usual, she has been exercising less frequently (due to back problems), and working more than usually. Drinks 120 oz of water daily. Has noticed weight gain of about 10 lbs recently, per chart review up 7lbs from visit in 07/2015 (weight measured 136 lbs today and 129 lbs last year). Endorses passing gas and abdominal bloating. Denies diarrhea, straining, blood in stool. States she is supposed to start the "Medifast" diet which includes adding protein powder to several meals daily.   States she feels like the Omeprazole doesn't seem  to be helping her reflux very well. She has symptoms of dysphagia and heartburn about 3-5x week. Notes it usually with spicier foods, peanuts, and other nuts. Denies nausea or vomiting.   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 Denies currently being sexually active. Has Mirena IUD for contraception if becoming sexually active.  States she is taking Methylphenidate 36 mg daily as needed for her inattention symptoms. States she takes it a few times a week and feels it is pretty well-controlled with this regimen. She stopped Ritalin because she was getting restless leg syndrome with it and having trouble sleeping with the immediate release -- feels like it is pretty well-controlled with this, only takes it as she needs it  Her mammogram 6 months ago showed several cysts on right breast and was recommended to follow-up 6 months later for repeat (so around this time). States they did a breast ultrasound to confirm. Chart review reveals U/S results from 02/09/2016 showing asymmetry in right breast probably corresponding with a benign mass representing a cluster of cysts. Plans to get repeat U/S soon.  Review of Systems  Constitutional: Negative for activity change, appetite change, chills, diaphoresis, fatigue, fever and unexpected weight change.  HENT: Negative for congestion, dental problem, drooling, ear discharge, ear pain, facial swelling, hearing loss, mouth sores, nosebleeds, postnasal drip, rhinorrhea, sinus pain, sinus pressure, sneezing, sore throat, tinnitus, trouble swallowing and voice change.   Eyes: Negative for photophobia, pain, discharge, redness, itching and visual disturbance.  Respiratory: Negative  for apnea, cough, choking, chest tightness, shortness of breath, wheezing and stridor.   Cardiovascular: Negative for chest pain, palpitations and leg swelling.  Gastrointestinal: Positive  for constipation. Negative for abdominal distention, abdominal pain, anal bleeding, blood in stool, diarrhea, nausea, rectal pain and vomiting.  Endocrine: Negative for cold intolerance, heat intolerance, polydipsia, polyphagia and polyuria.  Genitourinary: Negative for decreased urine volume, difficulty urinating, dyspareunia, dysuria, enuresis, flank pain, frequency, genital sores, hematuria, menstrual problem, pelvic pain, urgency, vaginal bleeding, vaginal discharge and vaginal pain.  Musculoskeletal: Negative for arthralgias, back pain, gait problem, joint swelling, myalgias and neck pain.  Skin: Negative for color change, pallor, rash and wound.  Psychiatric/Behavioral: Negative for dysphoric mood.       Objective: Blood pressure 90/66, pulse 70, temperature 98.6 F (37 C), temperature source Oral, resp. rate 16, height 5\' 1"  (1.549 m), weight 136 lb (61.7 kg), last menstrual period 07/13/2016, SpO2 96 %.   Physical Exam  Constitutional: She is oriented to person, place, and time. She appears well-developed and well-nourished. No distress.  HENT:  Head: Normocephalic and atraumatic.  Right Ear: No drainage, swelling or tenderness. Tympanic membrane is not injected, not scarred, not perforated, not erythematous, not retracted and not bulging.  Left Ear: No drainage, swelling or tenderness. Tympanic membrane is not injected, not scarred, not perforated, not erythematous, not retracted and not bulging.  Nose: No mucosal edema, rhinorrhea, nose lacerations, septal deviation or nasal septal hematoma.  No foreign bodies.  Mouth/Throat: Uvula is midline, oropharynx is clear and moist and mucous membranes are normal. Mucous membranes are not pale, not dry and not cyanotic. No oral lesions. Normal dentition. No oropharyngeal exudate, posterior oropharyngeal edema, posterior oropharyngeal erythema or tonsillar abscesses.  Eyes: Conjunctivae are normal. Pupils are equal, round, and reactive to light.  Right eye exhibits no discharge and no exudate. Left eye exhibits no discharge and no exudate. Right conjunctiva is not injected. Left conjunctiva is not injected. No scleral icterus. Right pupil is round and reactive. Left pupil is round. Pupils are equal.  Neck: Normal range of motion. Neck supple. No tracheal deviation present. No thyromegaly present.  Cardiovascular: Normal rate, regular rhythm and normal heart sounds.  Exam reveals no gallop and no friction rub.   No murmur heard. Pulses:      Radial pulses are 2+ on the right side, and 2+ on the left side.       Popliteal pulses are 2+ on the right side, and 2+ on the left side.       Dorsalis pedis pulses are 2+ on the right side, and 2+ on the left side.       Posterior tibial pulses are 2+ on the right side, and 2+ on the left side.  Pulmonary/Chest: Effort normal and breath sounds normal. No stridor. No respiratory distress. She has no decreased breath sounds. She has no wheezes. She has no rhonchi. She has no rales.  Abdominal: Bowel sounds are normal. She exhibits no distension. There is no tenderness. There is no rebound and no guarding.  Lymphadenopathy:       Head (right side): No submental, no submandibular, no tonsillar, no preauricular and no posterior auricular adenopathy present.       Head (left side): No submental, no submandibular, no tonsillar, no preauricular and no posterior auricular adenopathy present.    She has no cervical adenopathy.  Neurological: She is alert and oriented to person, place, and time. No cranial nerve deficit or sensory deficit.  Reflex Scores:      Brachioradialis reflexes are 2+ on the right side and 2+ on the left side.      Patellar reflexes are 2+ on the right side and 2+ on the left side.      Achilles reflexes are 2+ on the right side and 2+ on the left side. Skin: Skin is warm and dry. No rash noted. She is not diaphoretic. No erythema.  Psychiatric: She has a normal mood and affect. Her  behavior is normal.    Results for orders placed or performed in visit on 07/17/16  POCT urinalysis dipstick  Result Value Ref Range   Color, UA yellow yellow   Clarity, UA clear clear   Glucose, UA negative negative   Bilirubin, UA negative negative   Ketones, POC UA negative negative   Spec Grav, UA 1.015    Blood, UA negative negative   pH, UA 7.0    Protein Ur, POC negative negative   Urobilinogen, UA 0.2    Nitrite, UA Negative Negative   Leukocytes, UA Negative Negative  POCT Microscopic Urinalysis ([facility])  Result Value Ref Range   WBC,UR,HPF,POC None None WBC/hpf   RBC,UR,HPF,POC None None RBC/hpf   Bacteria None None, Too numerous to count   Mucus Present (A) Absent   Epithelial Cells, UR Per Microscopy Few (A) None, Too numerous to count cells/hpf      Assessment & Plan:  1. Annual physical exam UTD on current health maintenance. Age-appropriate anticipatory guidance provided.   2. Constipation, unspecified constipation type Advised increased fiber in diet, continue at least 64 oz of water daily, and take Miralax daily to keep bowel movements on schedule. RTC if symptoms are not improving with this regimen. - TSH  3. Gastroesophageal reflux disease without esophagitis Continue use of Omeprazole and recommended OTC Ranitidine in addition. Advised to RTC if symptoms are worsening or not improving for H.Pylori test.  4. Weight gain Weight gain of 7 lbs over past year. Likely due to changes in diet and decreased exercise but will obtain CBC, TSH, CMP. Will update patient upon review of results. - TSH - CBC with Differential/Platelet - Comprehensive metabolic panel  5. Screening for hyperlipidemia - Lipid panel  6. Screening for blood or protein in urine UA negative. - POCT urinalysis dipstick - POCT Microscopic Urinalysis ([facility])

## 2016-07-17 NOTE — Progress Notes (Signed)
Patient ID: Garen Lah, female    DOB: 1971/08/17, 45 y.o.   MRN: [id number]  PCP: Harrison Mons, PA-C  Chief Complaint  Patient presents with  . Annual Exam    pt has already had pap x 3 months ago    Subjective:   Presents for [employer].  Experiencing significant constipation. Typical frequency is Q2-3 days, but was down to 1/week for the past month. Has had improvement with regular, daily use of Miralax rather than waiting until she was feeling "really backed up."  Hasn't been eating a particularly healthy, and is embarking on a new eating plan that is high protein. Due to LBP from spondylolisthesis, she has not been getting as much exercise as previously and is frustrated by an estimated 10 lb weight gain in the past year. She also notes that the omeprazole isn't helping as much as is was previously.  Cervical Cancer Screening: with GYN 05/29/2016 Breast Cancer Screening: 6 months ago Colorectal Cancer Screening: not yet a candidate Bone Density Testing: not yet a candidate HIV Screening: complete 08/2009 STI Screening: very low risk, not sexually active Seasonal Influenza Vaccination: current, 05/2016 Td/Tdap Vaccination: current, 2013 Pneumococcal Vaccination: not yet a candidate Zoster Vaccination: not yet a candidate Frequency of Dental evaluation: Q6 months Frequency of Eye evaluation: n/a     Patient Active Problem List   Diagnosis Date Noted  . Melasma 03/11/2015  . GERD (gastroesophageal reflux disease) 03/06/2015  . Insomnia 08/17/2012  . Anxiety 08/17/2012  . 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 Anxiety   . GERD (gastroesophageal reflux disease)   . Insomnia      Prior to Admission medications   Medication Sig Start Date End Date Taking? Authorizing Provider  citalopram (CELEXA) 40 MG tablet Take 1 tablet (40 mg total) by mouth daily. 04/13/16  Yes Julie-Anne Torain, PA-C  levonorgestrel (MIRENA) 20  MCG/24HR IUD 1 each by Intrauterine route once.   Yes Historical Provider, MD  methylphenidate 36 MG PO CR tablet Take 1 tablet (36 mg total) by mouth daily. 03/12/16  Yes Deairra Halleck, PA-C  omeprazole (PRILOSEC) 40 MG capsule Take 1 capsule (40 mg total) by mouth daily. 04/13/16  Yes Kamaiyah Uselton, PA-C  Probiotic Product (VSL#3) CAPS Take 1 capsule by mouth 2 (two) times daily. 03/06/15  Yes Melquan Ernsberger, PA-C  valACYclovir (VALTREX) 500 MG tablet TAKE ONE CAPLET BY MOUTH TWICE DAILY 11/07/15  Yes Donell Sliwinski, PA-C  methylphenidate (RITALIN) 10 MG tablet Take 1 tablet (10 mg total) by mouth 2 (two) times daily as needed. Patient not taking: Reported on 07/17/2016 04/13/16   Harrison Mons, PA-C    Allergies  Allergen Reactions  . Trazodone And Nefazodone Other (See Comments)    dizziness blurred vision difficulty concentrating and overall poor feeling.    Past Surgical History:  Procedure Laterality Date  . TONSILLECTOMY  1998    Family History  Problem Relation Age of Onset  . Hypertension Mother   . Hypertension Sister   . Healthy Father   . Stroke Maternal Grandfather   . Stroke Paternal Grandfather   . Irritable bowel syndrome Son     Social History   Social History  . Marital status: Single    Spouse name: n/a  . Number of children: 2  . Years of education: Master's   Occupational History  . speech therapist    Social History Main Topics  . Smoking status: Never  Smoker  . Smokeless tobacco: Never Used  . Alcohol use 0.6 oz/week    1 Cans of beer per week  . Drug use: No  . Sexual activity: Not Currently    Partners: Male    Birth control/ protection: IUD   Other Topics Concern  . None   Social History Narrative   Divorced. Lives alone every other week, when her sons are with their father.        Review of Systems Constitutional: Negative for activity change, appetite change, chills, diaphoresis, fatigue, fever and unexpected weight change.    HENT: Negative for congestion, dental problem, drooling, ear discharge, ear pain, facial swelling, hearing loss, mouth sores, nosebleeds, postnasal drip, rhinorrhea, sinus pain, sinus pressure, sneezing, sore throat, tinnitus, trouble swallowing and voice change.   Eyes: Negative for photophobia, pain, discharge, redness, itching and visual disturbance.  Respiratory: Negative for apnea, cough, choking, chest tightness, shortness of breath, wheezing and stridor.   Cardiovascular: Negative for chest pain, palpitations and leg swelling.  Gastrointestinal: Positive for constipation. Negative for abdominal distention, abdominal pain, anal bleeding, blood in stool, diarrhea, nausea, rectal pain and vomiting.  Endocrine: Negative for cold intolerance, heat intolerance, polydipsia, polyphagia and polyuria.  Genitourinary: Negative for decreased urine volume, difficulty urinating, dyspareunia, dysuria, enuresis, flank pain, frequency, genital sores, hematuria, menstrual problem, pelvic pain, urgency, vaginal bleeding, vaginal discharge and vaginal pain.  Musculoskeletal: Negative for arthralgias, back pain, gait problem, joint swelling, myalgias and neck pain.  Skin: Negative for color change, pallor, rash and wound.  Psychiatric/Behavioral: Negative for dysphoric mood.      Objective:  Physical Exam  Constitutional: She is oriented to person, place, and time. Vital signs are normal. She appears well-developed and well-nourished. She is active and cooperative. No distress.  BP 90/66   Pulse 70   Temp 98.6 F (37 C) (Oral)   Resp 16   Ht 5\' 1"  (1.549 m)   Wt 136 lb (61.7 kg)   LMP 07/13/2016   SpO2 96%   BMI 25.70 kg/m    HENT:  Head: Normocephalic and atraumatic.  Right Ear: Hearing, tympanic membrane, external ear and ear canal normal. No foreign bodies.  Left Ear: Hearing, tympanic membrane, external ear and ear canal normal. No foreign bodies.  Nose: Nose normal.  Mouth/Throat: Uvula is  midline, oropharynx is clear and moist and mucous membranes are normal. No oral lesions. Normal dentition. No dental abscesses or uvula swelling. No oropharyngeal exudate.  Eyes: Conjunctivae, EOM and lids are normal. Pupils are equal, round, and reactive to light. Right eye exhibits no discharge. Left eye exhibits no discharge. No scleral icterus.  Fundoscopic exam:      The right eye shows no arteriolar narrowing, no AV nicking, no exudate, no hemorrhage and no papilledema. The right eye shows red reflex.       The left eye shows no arteriolar narrowing, no AV nicking, no exudate, no hemorrhage and no papilledema. The left eye shows red reflex.  Neck: Trachea normal, normal range of motion and full passive range of motion without pain. Neck supple. No spinous process tenderness and no muscular tenderness present. No thyroid mass and no thyromegaly present.  Cardiovascular: Normal rate, regular rhythm, normal heart sounds, intact distal pulses and normal pulses.   Pulmonary/Chest: Effort normal and breath sounds normal.  Musculoskeletal: She exhibits no edema or tenderness.       Cervical back: Normal.       Thoracic back: Normal.  Lumbar back: Normal.  Lymphadenopathy:       Head (right side): No tonsillar, no preauricular, no posterior auricular and no occipital adenopathy present.       Head (left side): No tonsillar, no preauricular, no posterior auricular and no occipital adenopathy present.    She has no cervical adenopathy.       Right: No supraclavicular adenopathy present.       Left: No supraclavicular adenopathy present.  Neurological: She is alert and oriented to person, place, and time. She has normal strength and normal reflexes. No cranial nerve deficit. She exhibits normal muscle tone. Coordination and gait normal.  Skin: Skin is warm, dry and intact. No rash noted. She is not diaphoretic. No cyanosis or erythema. Nails show no clubbing.  Psychiatric: She has a normal mood  and affect. Her speech is normal and behavior is normal. Judgment and thought content normal.           Assessment & Plan:  1. Annual physical exam Age appropriate anticipatory guidance provided.  2. Constipation, unspecified constipation type Increase OTC Miralax to daily. Increase fiber and fitness. Maintain good oral hydration. Consider addition of magnesium supplement with the new diet plan if she's not able to consume adequate vegetables (fruit not allowed for the first 30 days). - TSH  3. Gastroesophageal reflux disease without esophagitis Inadequate control with omeprazole. Add OTC ranitidine. If not helpful, would check H pylori breath test.  4. Weight gain Lifetsyle cahnges. - TSH - CBC with Differential/Platelet - Comprehensive metabolic panel  5. Screening for hyperlipidemia - Lipid panel  6. Screening for blood or protein in urine - POCT urinalysis dipstick - POCT Microscopic Urinalysis ([facility])   Fara Chute, PA-C Physician Assistant-Certified [facility]

## 2016-07-17 NOTE — Patient Instructions (Addendum)
Drink at lest 64 ounces of water daily. Use the Miralax DAILY. Consider additional magnesium when you increase the protein in your diet. Regular cardiovascular exercise can also help with regular bowel movements.  Add OTC ranitidine 150 mg, twice daily, to your regimen to help with the heartburn. If that doesn't help, we'll check some additional labs.    IF you received an x-ray today, you will receive an invoice from [facility]. Please contact [facility] at [phone] with questions or concerns regarding your invoice.   IF you received labwork today, you will receive an invoice from [employer]. Please contact [facility] at [phone] with questions or concerns regarding your invoice.   Our billing staff will not be able to assist you with questions regarding bills from these companies.  You will be contacted with the lab results as soon as they are available. The fastest way to get your results is to activate your My Chart account. Instructions are located on the last page of this paperwork. If you have not heard from [place] regarding the results in 2 weeks, please contact this office.    Keeping You Healthy  Get These Tests 1. Blood Pressure- Have your blood pressure checked once a year by your health care provider.  Normal blood pressure is 120/80. 2. Weight- Have your body mass index (BMI) calculated to screen for obesity.  BMI is measure of body fat based on height and weight.  You can also calculate your own BMI at GravelBags.it. 3. Cholesterol- Have your cholesterol checked every 5 years starting at age 58 then yearly starting at age 50. 21. Chlamydia, HIV, and other sexually transmitted diseases- Get screened every year until age 78, then within three months of each new sexual provider. 5. Pap Test - Every 1-5 years; discuss with your health care provider. 6. Mammogram- Every 1-2 years starting at age 27--50  Take these  medicines  Calcium with Vitamin D-Your body needs 1200 mg of Calcium each day and [phone] IU of Vitamin D daily.  Your body can only absorb 500 mg of Calcium at a time so Calcium must be taken in 2 or 3 divided doses throughout the day.  Multivitamin with folic acid- Once daily if it is possible for you to become pregnant.  Get these Immunizations  Gardasil-Series of three doses; prevents HPV related illness such as genital warts and cervical cancer.  Menactra-Single dose; prevents meningitis.  Tetanus shot- Every 10 years.  Flu shot-Every year.  Take these steps 1. Do not smoke-Your healthcare provider can help you quit.  For tips on how to quit go to www.smokefree.gov or call 1-800 QUITNOW. 2. Be physically active- Exercise 5 days a week for at least 30 minutes.  If you are not already physically active, start slow and gradually work up to 30 minutes of moderate physical activity.  Examples of moderate activity include walking briskly, dancing, swimming, bicycling, etc. 3. Breast Cancer- A self breast exam every month is important for early detection of breast cancer.  For more information and instruction on self breast exams, ask your healthcare provider or https://www.patel.info/. 4. Eat a healthy diet- Eat a variety of healthy foods such as fruits, vegetables, whole grains, low fat milk, low fat cheeses, yogurt, lean meats, poultry and fish, beans, nuts, tofu, etc.  For more information go to www. Thenutritionsource.org 5. Drink alcohol in moderation- Limit alcohol intake to one drink or less per day. Never drink and drive. 6. Depression- Your emotional health  is as important as your physical health.  If you're feeling down or losing interest in things you normally enjoy please talk to your healthcare provider about being screened for depression. 7. Dental visit- Brush and floss your teeth twice daily; visit your dentist twice a year. 8. Eye doctor- Get an eye exam  at least every 2 years. 9. Helmet use- Always wear a helmet when riding a bicycle, motorcycle, rollerblading or skateboarding. 48. Safe sex- If you may be exposed to sexually transmitted infections, use a condom. 11. Seat belts- Seat belts can save your live; always wear one. 12. Smoke/Carbon Monoxide detectors- These detectors need to be installed on the appropriate level of your home. Replace batteries at least once a year. 13. Skin cancer- When out in the sun please cover up and use sunscreen 15 SPF or higher. 14. Violence- If anyone is threatening or hurting you, please tell your healthcare provider.

## 2016-07-18 LAB — TSH: TSH: 0.62 mIU/L

## 2016-08-09 ENCOUNTER — Other Ambulatory Visit: Payer: Self-pay | Admitting: Obstetrics and Gynecology

## 2016-08-09 DIAGNOSIS — N6489 Other specified disorders of breast: Secondary | ICD-10-CM

## 2016-08-16 ENCOUNTER — Ambulatory Visit
Admission: RE | Admit: 2016-08-16 | Discharge: 2016-08-16 | Disposition: A | Discharge status: Home / Self Care | Payer: Managed Care, Other (non HMO) | Source: Ambulatory Visit | Attending: Obstetrics and Gynecology | Admitting: Obstetrics and Gynecology

## 2016-08-16 DIAGNOSIS — N6489 Other specified disorders of breast: Secondary | ICD-10-CM

## 2016-11-30 ENCOUNTER — Encounter: Payer: Self-pay | Admitting: Physician Assistant

## 2016-11-30 MED ORDER — AMPHETAMINE-DEXTROAMPHETAMINE 10 MG PO TABS: 10.0000 mg | ORAL_TABLET | Freq: Every day | ORAL | 0 refills | Status: DC

## 2016-11-30 NOTE — Telephone Encounter (Signed)
Patient notified via My Chart.  Meds ordered this encounter  Medications  . amphetamine-dextroamphetamine (ADDERALL) 10 MG tablet    Sig: Take 1 tablet (10 mg total) by mouth daily with breakfast.    Dispense:  60 tablet    Refill:  0    Order Specific Question:   Supervising Provider    Answer:   Brigitte Pulse, EVA N [[id number]]

## 2017-02-28 ENCOUNTER — Other Ambulatory Visit: Payer: Self-pay | Admitting: Obstetrics and Gynecology

## 2017-02-28 DIAGNOSIS — N6489 Other specified disorders of breast: Secondary | ICD-10-CM

## 2017-02-28 DIAGNOSIS — N63 Unspecified lump in unspecified breast: Secondary | ICD-10-CM

## 2017-03-10 ENCOUNTER — Other Ambulatory Visit: Payer: Self-pay | Admitting: Obstetrics and Gynecology

## 2017-03-10 ENCOUNTER — Ambulatory Visit
Admission: RE | Admit: 2017-03-10 | Discharge: 2017-03-10 | Disposition: A | Discharge status: Home / Self Care | Payer: Managed Care, Other (non HMO) | Source: Ambulatory Visit | Attending: Obstetrics and Gynecology | Admitting: Obstetrics and Gynecology

## 2017-03-10 DIAGNOSIS — N63 Unspecified lump in unspecified breast: Secondary | ICD-10-CM

## 2017-03-10 DIAGNOSIS — N6489 Other specified disorders of breast: Secondary | ICD-10-CM

## 2017-03-14 ENCOUNTER — Other Ambulatory Visit: Payer: Self-pay | Admitting: Physician Assistant

## 2017-03-15 ENCOUNTER — Telehealth: Payer: Self-pay

## 2017-03-15 MED ORDER — AMPHETAMINE-DEXTROAMPHETAMINE 10 MG PO TABS: 10.0000 mg | ORAL_TABLET | Freq: Every day | ORAL | 0 refills | Status: DC

## 2017-03-15 NOTE — Telephone Encounter (Signed)
Patient notified via My Chart.  Meds ordered this encounter  Medications  . amphetamine-dextroamphetamine (ADDERALL) 10 MG tablet    Sig: Take 1 tablet (10 mg total) by mouth daily with breakfast.    Dispense:  60 tablet    Refill:  0

## 2017-03-15 NOTE — Telephone Encounter (Signed)
Spoke with Pt and let her know she has a Rx ready for pickup at [facility] [address]. Pt stated she would try to pick it up today before closing.

## 2017-03-29 NOTE — Telephone Encounter (Signed)
Looks like when we called this patient to notify her that her prescription was ready, we did not note that I had sent her a My Chart message advising that I'd printed the prescription, and she needed a follow-up visit for the next fill.  She has not read the My Chart message. Please contact her to schedule a follow-up visit with me.

## 2017-05-10 ENCOUNTER — Ambulatory Visit (INDEPENDENT_AMBULATORY_CARE_PROVIDER_SITE_OTHER): Payer: Managed Care, Other (non HMO) | Admitting: Physician Assistant

## 2017-05-10 ENCOUNTER — Encounter: Payer: Self-pay | Admitting: Physician Assistant

## 2017-05-10 ENCOUNTER — Telehealth: Payer: Self-pay | Admitting: Physician Assistant

## 2017-05-10 VITALS — BP 105/70 | HR 73 | Temp 98.6°F | Resp 18 | Ht 61.0 in | Wt 139.8 lb

## 2017-05-10 DIAGNOSIS — F988 Other specified behavioral and emotional disorders with onset usually occurring in childhood and adolescence: Secondary | ICD-10-CM

## 2017-05-10 DIAGNOSIS — F419 Anxiety disorder, unspecified: Secondary | ICD-10-CM

## 2017-05-10 MED ORDER — AMPHETAMINE-DEXTROAMPHETAMINE 10 MG PO TABS: ORAL_TABLET | ORAL | 0 refills | Status: DC

## 2017-05-10 MED ORDER — CITALOPRAM HYDROBROMIDE 40 MG PO TABS: 40.0000 mg | ORAL_TABLET | Freq: Every day | ORAL | 3 refills | Status: AC

## 2017-05-10 NOTE — Patient Instructions (Signed)
     IF you received an x-ray today, you will receive an invoice from [facility]. Please contact [facility] at [phone] with questions or concerns regarding your invoice.   IF you received labwork today, you will receive an invoice from [facility]. Please contact [facility] at [phone] with questions or concerns regarding your invoice.   Our billing staff will not be able to assist you with questions regarding bills from these companies.  You will be contacted with the lab results as soon as they are available. The fastest way to get your results is to activate your My Chart account. Instructions are located on the last page of this paperwork. If you have not heard from us regarding the results in 2 weeks, please contact this office.     

## 2017-05-10 NOTE — Telephone Encounter (Signed)
Pt is needing a refill on her adderall   Best number [phone]

## 2017-05-10 NOTE — Progress Notes (Signed)
Patient ID: Allison Miller, female    DOB: 08/29/1971, 46 y.o.   MRN: [id number]  PCP: Harrison Mons, PA-C  Chief Complaint  Patient presents with  . Medication Refill    Adderall 10 MG, Pt states she will be getting Flu vaccine at work.    Subjective:   Presents for evaluation of ADD and for prescription refill.  Sons both smoking marijuana and tobacco. Initially started 2 years ago, and after "caught" she thought they'd stopped. But recently found them again, this time in her home. Have had an encounter with [employer], but no charges were filed.  She has put a tracker on her car, but their father refuses to implement significant consequences when they stay with him. Their father, at her urging, performed home urine drug testing on them, though she isn't sure that she trusts negative results on her younger son, Thomasena Edis, as he was allowed to provide his specimen in private. Their father isn't consistent with discipline, tries to be a friend, even providing rewards when they have broken rules.  She believes that her older son, Vonna Kotyk, has an anxiety disorder. Her ex-husband blew her off when she mentioned it when they were still married. Both she and he take medication for anxiety, and she suspects that Vonna Kotyk is using as a way to self-treat his own anxiety symptoms.  Doesn't take Adderall every day. Feels focused when she is wearing headphones and sitting at her desk. I still hear all the conversations around me. Sometimes needs an afternoon dose. Doesn't take it after 2 pm, due to resulting insomnia.    Review of Systems As above.    Patient Active Problem List   Diagnosis Date Noted  . Melasma 03/11/2015  . GERD (gastroesophageal reflux disease) 03/06/2015  . Insomnia 08/17/2012  . Anxiety 08/17/2012  . 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 Authorizing Provider  amphetamine-dextroamphetamine  (ADDERALL) 10 MG tablet Take 1 tablet (10 mg total) by mouth daily with breakfast. 03/15/17  Yes Almarosa Bohac, PA-C  citalopram (CELEXA) 40 MG tablet Take 1 tablet (40 mg total) by mouth daily. 04/13/16  Yes Aluel Schwarz, Domingo Mend, PA-C  levonorgestrel (MIRENA) 20 MCG/24HR IUD 1 each by Intrauterine route once.   Yes [provider]  omeprazole (PRILOSEC) 40 MG capsule Take 1 capsule (40 mg total) by mouth daily. 04/13/16  Yes Jerrell Mangel, PA-C  Probiotic Product (VSL#3) CAPS Take 1 capsule by mouth 2 (two) times daily. 03/06/15  Yes Ameshia Pewitt, PA-C  valACYclovir (VALTREX) 500 MG tablet TAKE ONE CAPLET BY MOUTH TWICE DAILY 11/07/15  Yes Ellean Firman, PA-C     Allergies  Allergen Reactions  . Trazodone And Nefazodone Other (See Comments)    dizziness blurred vision difficulty concentrating and overall poor feeling.       Objective:  Physical Exam  Constitutional: She is oriented to person, place, and time. She appears well-developed and well-nourished. She is active and cooperative. No distress.  BP 105/70 (BP Location: Right Arm, Patient Position: Sitting, Cuff Size: Normal)   Pulse 73   Temp 98.6 F (37 C) (Oral)   Resp 18   Ht 5\' 1"  (1.549 m)   Wt 139 lb 12.8 oz (63.4 kg)   SpO2 96%   BMI 26.41 kg/m   HENT:  Head: Normocephalic and atraumatic.  Right Ear: Hearing normal.  Left Ear: Hearing normal.  Eyes: Conjunctivae are normal. No scleral  icterus.  Neck: Normal range of motion. Neck supple. No thyromegaly present.  Cardiovascular: Normal rate, regular rhythm and normal heart sounds.   Pulses:      Radial pulses are 2+ on the right side, and 2+ on the left side.  Pulmonary/Chest: Effort normal and breath sounds normal.  Lymphadenopathy:       Head (right side): No tonsillar, no preauricular, no posterior auricular and no occipital adenopathy present.       Head (left side): No tonsillar, no preauricular, no posterior auricular and no occipital adenopathy  present.    She has no cervical adenopathy.       Right: No supraclavicular adenopathy present.       Left: No supraclavicular adenopathy present.  Neurological: She is alert and oriented to person, place, and time. No sensory deficit.  Skin: Skin is warm, dry and intact. No rash noted. No cyanosis or erythema. Nails show no clubbing.  Psychiatric: She has a normal mood and affect. Her speech is normal and behavior is normal.       Assessment & Plan:   Problem List Items Addressed This Visit    Anxiety (Chronic)    Generally well controlled, but exacerbated by the stress over her sons' drug use. Doesn't feel like she needs to increase treatment. Discussed strategies for managing the issue, including conversations with her ex and her sons, potential need for treatment, rules and consequences, etc.      Relevant Medications   citalopram (CELEXA) 40 MG tablet   Attention deficit disorder (ADD) without hyperactivity - Primary    Tolerating Adderall without adverse effects. Continue.      Relevant Medications   amphetamine-dextroamphetamine (ADDERALL) 10 MG tablet       Return in about 3 months (around 08/09/2017) for follow-up of ADD and anxiety.   Fara Chute, PA-C [facility]

## 2017-05-10 NOTE — Telephone Encounter (Signed)
done

## 2017-05-12 DIAGNOSIS — F988 Other specified behavioral and emotional disorders with onset usually occurring in childhood and adolescence: Secondary | ICD-10-CM | POA: Insufficient documentation

## 2017-05-12 NOTE — Assessment & Plan Note (Signed)
Generally well controlled, but exacerbated by the stress over her sons' drug use. Doesn't feel like she needs to increase treatment. Discussed strategies for managing the issue, including conversations with her ex and her sons, potential need for treatment, rules and consequences, etc.

## 2017-05-12 NOTE — Assessment & Plan Note (Signed)
Tolerating Adderall without adverse effects. Continue.

## 2017-05-26 ENCOUNTER — Encounter: Payer: Self-pay | Admitting: Physician Assistant

## 2017-05-27 NOTE — Telephone Encounter (Signed)
I called [facility]. Spoke with pharmacy staff. Needed clarification regarding 1 pm dose: 1 tablet (10 mg) vs 1/2 tablet (5 mg). Correct instructions should read: Take 2 tablets (20 mg) QAM, take 0.5-1 tablet (5-10 mg) at 1 pm.

## 2017-05-27 NOTE — Telephone Encounter (Signed)
Please call [facility] to help clarify the issue.

## 2017-06-30 ENCOUNTER — Encounter: Payer: Self-pay | Admitting: Physician Assistant

## 2017-07-06 ENCOUNTER — Other Ambulatory Visit: Payer: Self-pay | Admitting: Physician Assistant

## 2017-07-06 DIAGNOSIS — F419 Anxiety disorder, unspecified: Secondary | ICD-10-CM

## 2017-08-09 ENCOUNTER — Ambulatory Visit: Payer: Managed Care, Other (non HMO) | Admitting: Physician Assistant

## 2017-08-16 ENCOUNTER — Ambulatory Visit: Payer: Managed Care, Other (non HMO) | Admitting: Physician Assistant

## 2017-09-12 ENCOUNTER — Inpatient Hospital Stay: Admission: RE | Admit: 2017-09-12 | Payer: Managed Care, Other (non HMO) | Source: Ambulatory Visit

## 2017-09-24 ENCOUNTER — Other Ambulatory Visit: Payer: Self-pay | Admitting: Physician Assistant

## 2017-09-24 ENCOUNTER — Encounter: Payer: Self-pay | Admitting: Physician Assistant

## 2017-09-24 DIAGNOSIS — F988 Other specified behavioral and emotional disorders with onset usually occurring in childhood and adolescence: Secondary | ICD-10-CM

## 2017-09-27 MED ORDER — AMPHETAMINE-DEXTROAMPHETAMINE 10 MG PO TABS: ORAL_TABLET | ORAL | 0 refills | Status: DC

## 2017-09-27 NOTE — Telephone Encounter (Signed)
Please advise 

## 2017-09-27 NOTE — Telephone Encounter (Signed)
Rx sent electronically.  Meds ordered this encounter  Medications  . amphetamine-dextroamphetamine (ADDERALL) 10 MG tablet    Sig: Take 2 tablets (20 mg) QAM, take 1 tablet (5 mg) at 1 pm.    Dispense:  90 tablet    Refill:  0

## 2017-10-12 ENCOUNTER — Ambulatory Visit (INDEPENDENT_AMBULATORY_CARE_PROVIDER_SITE_OTHER): Payer: Managed Care, Other (non HMO) | Admitting: Physician Assistant

## 2017-10-12 VITALS — BP 100/62 | HR 72 | Temp 97.7°F | Resp 16 | Ht 61.0 in | Wt 134.8 lb

## 2017-10-12 DIAGNOSIS — G2581 Restless legs syndrome: Secondary | ICD-10-CM

## 2017-10-12 DIAGNOSIS — F988 Other specified behavioral and emotional disorders with onset usually occurring in childhood and adolescence: Secondary | ICD-10-CM | POA: Diagnosis not present

## 2017-10-12 MED ORDER — AMPHETAMINE-DEXTROAMPHETAMINE 30 MG PO TABS: 30.0000 mg | ORAL_TABLET | Freq: Two times a day (BID) | ORAL | 0 refills | Status: DC

## 2017-10-12 NOTE — Patient Instructions (Addendum)
The afternoon dose of the Adderall can be 1/2 tablet (15 mg) if you prefer.    IF you received an x-ray today, you will receive an invoice from [facility]. Please contact [facility] at [phone] with questions or concerns regarding your invoice.   IF you received labwork today, you will receive an invoice from [facility]. Please contact [facility] at [phone] with questions or concerns regarding your invoice.   Our billing staff will not be able to assist you with questions regarding bills from these companies.  You will be contacted with the lab results as soon as they are available. The fastest way to get your results is to activate your My Chart account. Instructions are located on the last page of this paperwork. If you have not heard from [place] regarding the results in 2 weeks, please contact this office.

## 2017-10-12 NOTE — Progress Notes (Signed)
Patient ID: Allison Miller, female    DOB: [birth year], 47 y.o.   MRN: [id number]  PCP: Harrison Mons, PA-C  Chief Complaint  Patient presents with  . restless leg  . Medication Management    adderall    Subjective:   Presents for evaluation of ADD and RLS-type symptoms.  Taking Adderall 20 mg QAM, 5 mg Qafternoon. Still feels crazy distracted. Coworkers tell her, "You're so nosy." "I hear everything around me." "The only way I can focus is to put headphones on me and listen to classical music and nobody in the room."  The IR seems to work better than the ER we have tried.  Spending extra 5 hours on work (takes >13 hours to complete 8 hours of work). Some days are OK.  Has started exercising again. Took some leftover trazodone (not remembering that she had significant adverse effects), without benefit, and recurrence of feeling wired and awake.  Intermittent RIGHT leg RLS symptoms x 8 months. No pain, just the need to move her leg.   Review of Systems No CP, SOB, HA, dizziness. No nausea, vomiting, constipation, diarrhea. No urinary symptoms.    Patient Active Problem List   Diagnosis Date Noted  . Attention deficit disorder (ADD) without hyperactivity 05/12/2017  . Melasma 03/11/2015  . GERD (gastroesophageal reflux disease) 03/06/2015  . Insomnia 08/17/2012  . Anxiety 08/17/2012  . 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 Authorizing Provider  amphetamine-dextroamphetamine (ADDERALL) 10 MG tablet Take 2 tablets (20 mg) QAM, take 1 tablet (5 mg) at 1 pm. 09/27/17  Yes Gay Rape, PA-C  citalopram (CELEXA) 40 MG tablet Take 1 tablet (40 mg total) by mouth daily. 05/10/17  Yes Treesa Mccully, Domingo Mend, PA-C  levonorgestrel (MIRENA) 20 MCG/24HR IUD 1 each by Intrauterine route once.   Yes [provider]  omeprazole (PRILOSEC) 40 MG capsule Take 1 capsule (40 mg total) by mouth daily.  04/13/16  Yes Kadedra Vanaken, PA-C  Probiotic Product (VSL#3) CAPS Take 1 capsule by mouth 2 (two) times daily. 03/06/15  Yes Copelyn Widmer, PA-C  valACYclovir (VALTREX) 500 MG tablet TAKE ONE CAPLET BY MOUTH TWICE DAILY 11/07/15  Yes Hershel Corkery, PA-C     Allergies  Allergen Reactions  . Trazodone And Nefazodone Other (See Comments)    dizziness blurred vision difficulty concentrating and overall poor feeling.       Objective:  Physical Exam  Constitutional: She is oriented to person, place, and time. She appears well-developed and well-nourished. She is active and cooperative. No distress.  BP 100/62   Pulse 72   Temp 97.7 F (36.5 C) (Oral)   Resp 16   Ht 5\' 1"  (1.549 m)   Wt 134 lb 12.8 oz (61.1 kg)   SpO2 100%   BMI 25.47 kg/m    Eyes: Conjunctivae are normal.  Pulmonary/Chest: Effort normal.  Neurological: She is alert and oriented to person, place, and time.  Psychiatric: She has a normal mood and affect. Her speech is normal and behavior is normal.      Assessment & Plan:   Problem List Items Addressed This Visit    Attention deficit disorder (ADD) without hyperactivity - Primary    Increase Adderall dose. Continue efforts to get good sleep and employ strategies to help reduce distraction. Refer to psych to help with medication management.      Relevant Medications   amphetamine-dextroamphetamine (ADDERALL) 30 MG  tablet   Other Relevant Orders   Ambulatory referral to Psychiatry    Other Visit Diagnoses    Restless legs syndrome (RLS)       Relevant Orders   CBC with Differential/Platelet (Completed)   TSH (Completed)   B12 and Folate Panel (Completed)       Return in about 3 months (around 01/09/2018) for Annual Exam and re-evalaution of mood, restless leg, etc.   Fara Chute, PA-C [facility] [place]

## 2017-10-13 LAB — CBC WITH DIFFERENTIAL/PLATELET
Basophils Absolute: 0 10*3/uL (ref 0.0–0.2)
Basos: 0 %
EOS (ABSOLUTE): 0.1 10*3/uL (ref 0.0–0.4)
Eos: 2 %
HEMOGLOBIN: 13.8 g/dL (ref 11.1–15.9)
Hematocrit: 41.6 % (ref 34.0–46.6)
IMMATURE GRANS (ABS): 0 10*3/uL (ref 0.0–0.1)
Immature Granulocytes: 0 %
LYMPHS: 36 %
Lymphocytes Absolute: 2.5 10*3/uL (ref 0.7–3.1)
MCH: 29.9 pg (ref 26.6–33.0)
MCHC: 33.2 g/dL (ref 31.5–35.7)
MCV: 90 fL (ref 79–97)
MONOCYTES: 5 %
Monocytes Absolute: 0.4 10*3/uL (ref 0.1–0.9)
NEUTROS ABS: 3.9 10*3/uL (ref 1.4–7.0)
Neutrophils: 57 %
Platelets: 240 10*3/uL (ref 150–379)
RBC: 4.61 x10E6/uL (ref 3.77–5.28)
RDW: 13.1 % (ref 12.3–15.4)
WBC: 6.9 10*3/uL (ref 3.4–10.8)

## 2017-10-13 LAB — B12 AND FOLATE PANEL
Folate: 10.5 ng/mL (ref >3.0)
Vitamin B-12: 871 pg/mL (ref 232–1245)

## 2017-10-13 LAB — TSH: TSH: 1.26 u[IU]/mL (ref 0.450–4.500)

## 2017-10-18 ENCOUNTER — Telehealth: Payer: Self-pay | Admitting: Physician Assistant

## 2017-10-18 ENCOUNTER — Encounter: Payer: Self-pay | Admitting: Physician Assistant

## 2017-10-18 NOTE — Telephone Encounter (Signed)
Encounter completed. Thank you!

## 2017-10-18 NOTE — Telephone Encounter (Signed)
Pt sent MyChart message following up on status of psychiatry referral. Will send once OV notes are completed. Thanks!

## 2017-10-18 NOTE — Assessment & Plan Note (Signed)
Increase Adderall dose. Continue efforts to get good sleep and employ strategies to help reduce distraction. Refer to psych to help with medication management.

## 2017-10-19 NOTE — Telephone Encounter (Signed)
Sent to [place] attention specialist on 2/20

## 2017-12-01 ENCOUNTER — Encounter: Payer: Self-pay | Admitting: Physician Assistant

## 2017-12-22 ENCOUNTER — Encounter: Payer: Self-pay | Admitting: Physician Assistant

## 2017-12-24 MED ORDER — TRAZODONE HCL 50 MG PO TABS: 50.0000 mg | ORAL_TABLET | Freq: Every evening | ORAL | 3 refills | Status: DC | PRN

## 2018-01-12 ENCOUNTER — Ambulatory Visit
Admission: RE | Admit: 2018-01-12 | Discharge: 2018-01-12 | Disposition: A | Discharge status: Home / Self Care | Payer: Managed Care, Other (non HMO) | Source: Ambulatory Visit | Attending: Obstetrics and Gynecology | Admitting: Obstetrics and Gynecology

## 2018-01-12 DIAGNOSIS — N63 Unspecified lump in unspecified breast: Secondary | ICD-10-CM

## 2018-01-15 ENCOUNTER — Other Ambulatory Visit: Payer: Self-pay | Admitting: Physician Assistant

## 2018-01-16 NOTE — Telephone Encounter (Signed)
Requesting refill, please advise. Thank you

## 2018-01-17 NOTE — Telephone Encounter (Signed)
Please advise this patient that I have sent her prescription. She was to follow-up with me this month, but I do not see that she has a visit scheduled. Please encourage her to look at her medications, as I will not be able to prescribe for her after 6/12. She is welcome to schedule with me at [place], or establish with a colleague here.  Meds ordered this encounter  Medications  . traZODone (DESYREL) 50 MG tablet    Sig: TAKE 1-2 TABLETS (50-100 MG TOTAL) BY MOUTH AT BEDTIME AS NEEDED FOR SLEEP.    Dispense:  180 tablet    Refill:  3

## 2018-05-03 ENCOUNTER — Other Ambulatory Visit: Payer: Self-pay | Admitting: Obstetrics and Gynecology

## 2018-05-03 DIAGNOSIS — Z1231 Encounter for screening mammogram for malignant neoplasm of breast: Secondary | ICD-10-CM

## 2018-05-31 ENCOUNTER — Ambulatory Visit
Admission: RE | Admit: 2018-05-31 | Discharge: 2018-05-31 | Disposition: A | Discharge status: Home / Self Care | Payer: Managed Care, Other (non HMO) | Source: Ambulatory Visit | Attending: Obstetrics and Gynecology | Admitting: Obstetrics and Gynecology

## 2018-05-31 ENCOUNTER — Other Ambulatory Visit: Payer: Self-pay | Admitting: Gynecology

## 2018-05-31 DIAGNOSIS — Z1231 Encounter for screening mammogram for malignant neoplasm of breast: Secondary | ICD-10-CM

## 2018-06-19 ENCOUNTER — Encounter: Payer: Self-pay | Admitting: *Deleted

## 2018-06-20 ENCOUNTER — Telehealth: Payer: Self-pay | Admitting: Neurology

## 2018-06-20 ENCOUNTER — Ambulatory Visit (INDEPENDENT_AMBULATORY_CARE_PROVIDER_SITE_OTHER): Payer: Managed Care, Other (non HMO) | Admitting: Neurology

## 2018-06-20 ENCOUNTER — Encounter: Payer: Self-pay | Admitting: Neurology

## 2018-06-20 VITALS — BP 102/61 | HR 80 | Ht 61.0 in | Wt 132.0 lb

## 2018-06-20 DIAGNOSIS — R419 Unspecified symptoms and signs involving cognitive functions and awareness: Secondary | ICD-10-CM | POA: Diagnosis not present

## 2018-06-20 DIAGNOSIS — R413 Other amnesia: Secondary | ICD-10-CM

## 2018-06-20 NOTE — Telephone Encounter (Signed)
lvm for pt to be aware of this I also left their number of [phone] and to call if she has not heard anything in the next 2-3 business days.

## 2018-06-20 NOTE — Patient Instructions (Signed)
MRI brain w/wo contrast EEG   Memory Compensation Strategies  1. Use "WARM" strategy.  W= write it down  A= associate it  R= repeat it  M= make a mental note  2.   You can keep a Social worker.  Use a 3-ring notebook with sections for the following: calendar, important names and phone numbers,  medications, doctors' names/phone numbers, lists/reminders, and a section to journal what you did  each day.   3.    Use a calendar to write appointments down.  4.    Write yourself a schedule for the day.  This can be placed on the calendar or in a separate section of the Memory Notebook.  Keeping a  regular schedule can help memory.  5.    Use medication organizer with sections for each day or morning/evening pills.  You may need help loading it  6.    Keep a basket, or pegboard by the door.  Place items that you need to take out with you in the basket or on the pegboard.  You may also want to  include a message board for reminders.  7.    Use sticky notes.  Place sticky notes with reminders in a place where the task is performed.  For example: " turn off the  stove" placed by the stove, "lock the door" placed on the door at eye level, " take your medications" on  the bathroom mirror or by the place where you normally take your medications.  8.    Use alarms/timers.  Use while cooking to remind yourself to check on food or as a reminder to take your medicine, or as a  reminder to make a call, or as a reminder to perform another task, etc.

## 2018-06-20 NOTE — Progress Notes (Addendum)
[facility]    Provider:  Dr Jaynee Eagles Referring Provider: Bernerd Limbo, MD Primary Care Physician:  Bernerd Limbo, MD  CC:  Memory loss  HPI:  Allison Miller is a 47 y.o. female here as requested by Dr. Coletta Memos for memory. She says her memory is "horrible". Recently within 6-8 months if she is having a conversation she is completely lost, she forgets mid sentence. She has ADHD. If she get distracted she forget what she is doing. She tried Aderral, she is on Vyvanse now, she is seeing an ADHD specialist. She has sensory processing and auditory processing disorder. She is very distractable. She manages her own finances, work full time and doing well, Memory and attention has always been this way, she feels it is worse. She started taking Trazodone as well. No FHx of MS or Alzheimers. Paternal Grandmother had a lobotomy so unclear why or if memory loss related. Her father may be forgetting things recently he is 41. Having a difficult time retaining new memories. No hx of any focal neurologic problems as a child or growing up. No hx, signs or symptoms or seizures. Her 1/2 brother had a benign brain tumor. No accidents in the home, lots of stress and teenage boys. In the past stress has affected her memory and she was worked up in 2003 because she "couldn't tie her shoes".  No other focal neurologic deficits, associated symptoms, inciting events or modifiable factors.  Reviewed notes, labs and imaging from outside physicians, which showed:  She had an MRI on [place] in 2003 will try to request records.   B12 and tsh normal. She declines testing rpr and hiv.   Review of Systems: Patient complains of symptoms per HPI as well as the following symptoms: insomnia, sleepiness, memory loss, headache, numbness, dizziness, depression, anxirty, fatigue memory loss,  . Pertinent negatives and positives per HPI. All others negative.   Social History   Socioeconomic History  . Marital  status: Single    Spouse name: n/a  . Number of children: 2  . Years of education: Master's  . Highest education level: Not on file  Occupational History  . Occupation: speech therapist  Social Needs  . Financial resource strain: Not on file  . Food insecurity:    Worry: Not on file    Inability: Not on file  . Transportation needs:    Medical: Not on file    Non-medical: Not on file  Tobacco Use  . Smoking status: Never Smoker  . Smokeless tobacco: Never Used  Substance and Sexual Activity  . Alcohol use: Yes    Alcohol/week: 2.0 - 3.0 standard drinks    Types: 2 - 3 Cans of beer per week  . Drug use: No  . Sexual activity: Not Currently    Partners: Male    Birth control/protection: IUD  Lifestyle  . Physical activity:    Days per week: Not on file    Minutes per session: Not on file  . Stress: Not on file  Relationships  . Social connections:    Talks on phone: Not on file    Gets together: Not on file    Attends religious service: Not on file    Active member of club or organization: Not on file    Attends meetings of clubs or organizations: Not on file    Relationship status: Not on file  . Intimate partner violence:    Fear of current or ex partner: Not on file  Emotionally abused: Not on file    Physically abused: Not on file    Forced sexual activity: Not on file  Other Topics Concern  . Not on file  Social History Narrative   Divorced. Lives alone every other week, when her sons are with their father.     Family History  Problem Relation Age of Onset  . Hypertension Mother   . Hypertension Sister   . Healthy Father   . Stroke Maternal Grandfather   . Stroke Paternal Grandfather   . Irritable bowel syndrome Son   . Brain cancer Brother   . Dementia Paternal Grandmother     Past Medical History:  Diagnosis Date  . ADHD    without hyperactivity  . Anxiety   . Depression   . Genital HSV    hsv-2  . GERD (gastroesophageal reflux disease)     . Insomnia   . Insomnia   . Melasma     Past Surgical History:  Procedure Laterality Date  . TONSILLECTOMY  1998    Current Outpatient Medications  Medication Sig Dispense Refill  . citalopram (CELEXA) 40 MG tablet Take 1 tablet (40 mg total) by mouth daily. 90 tablet 3  . levonorgestrel (MIRENA) 20 MCG/24HR IUD 1 each by Intrauterine route once.    . lisdexamfetamine (VYVANSE) 20 MG capsule 60 mg daily. 40 mg in the morning and 20 mg in the afternoon    . omeprazole (PRILOSEC) 40 MG capsule Take 1 capsule (40 mg total) by mouth daily. (Patient taking differently: Take 40 mg by mouth daily as needed. ) 90 capsule 3  . traZODone (DESYREL) 50 MG tablet TAKE 1-2 TABLETS (50-100 MG TOTAL) BY MOUTH AT BEDTIME AS NEEDED FOR SLEEP. 180 tablet 3  . valACYclovir (VALTREX) 500 MG tablet TAKE ONE CAPLET BY MOUTH TWICE DAILY (Patient taking differently: Take 500 mg by mouth as needed. ) 180 tablet 2   No current facility-administered medications for this visit.     Allergies as of 06/20/2018 - Review Complete 06/20/2018  Allergen Reaction Noted  . Trazodone and nefazodone Other (See Comments) 08/11/2015  . Venlafaxine Other (See Comments) 10/12/2017    Vitals: BP 102/61   Pulse 80   Ht 5\' 1"  (1.549 m)   Wt 132 lb (59.9 kg)   BMI 24.94 kg/m  Last Weight:  Wt Readings from Last 1 Encounters:  06/20/18 132 lb (59.9 kg)   Last Height:   Ht Readings from Last 1 Encounters:  06/20/18 5\' 1"  (1.549 m)   Physical exam: Exam: Gen: NAD, conversant, well nourised, well groomed                     CV: RRR, no MRG. No Carotid Bruits. No peripheral edema, warm, nontender Eyes: Conjunctivae clear without exudates or hemorrhage  Neuro: Detailed Neurologic Exam  Speech:    Speech is normal; fluent and spontaneous with normal comprehension.  Cognition:  Montreal Cognitive Assessment  06/20/2018  Visuospatial/ Executive (0/5) 5  Naming (0/3) 3  Attention: Read list of digits (0/2) 2   Attention: Read list of letters (0/1) 1  Attention: Serial 7 subtraction starting at 100 (0/3) 3  Language: Repeat phrase (0/2) 1  Language : Fluency (0/1) 1  Abstraction (0/2) 2  Delayed Recall (0/5) 2  Orientation (0/6) 6  Total 26       The patient is oriented to person, place, and time;     recent and remote memory intact;  language fluent;     normal attention, concentration,     fund of knowledge Cranial Nerves:    The pupils are equal, round, and reactive to light. The fundi are normal and spontaneous venous pulsations are present. Visual fields are full to finger confrontation. Extraocular movements are intact. Trigeminal sensation is intact and the muscles of mastication are normal. The face is symmetric. The palate elevates in the midline. Hearing intact. Voice is normal. Shoulder shrug is normal. The tongue has normal motion without fasciculations.   Coordination:    Normal finger to nose and heel to shin. Normal rapid alternating movements.   Gait:    Heel-toe and tandem gait are normal.   Motor Observation:    No asymmetry, no atrophy, and no involuntary movements noted. Tone:    Normal muscle tone.    Posture:    Posture is normal. normal erect    Strength:    Strength is V/V in the upper and lower limbs.      Sensation: intact to LT     Reflex Exam:  DTR's:    Deep tendon reflexes in the upper and lower extremities are normal bilaterally.   Toes:    The toes are downgoing bilaterally.   Clonus:    Clonus is absent.       Assessment/Plan:  47 year old with episodes of memory loss, confusion, need MRI of the brain to evaluate for seizure focus or other etiology such as MS. Also need an EEG.   Likely multifactorial including stress, ADHD, anxiety/mood disorder, auditory and sensory processing disorders, normal cognitive aging but given lapses in memory nee to evaluate for seizures (absence?) and other etiology.  MRI brain w/wo seizure  protocol EEG  Cc: Bernerd Limbo, MD  Orders Placed This Encounter  Procedures  . MR BRAIN W WO CONTRAST  . EEG     Sarina Ill, MD  [facility] [address] [place], [place] [address]  Phone [phone] Fax [phone]

## 2018-06-20 NOTE — Telephone Encounter (Signed)
[employer] order sent to [facility]. They obtain the auth and will reach out to the pt to schedule.  °

## 2018-06-27 ENCOUNTER — Ambulatory Visit (INDEPENDENT_AMBULATORY_CARE_PROVIDER_SITE_OTHER): Payer: Managed Care, Other (non HMO) | Admitting: Neurology

## 2018-06-27 DIAGNOSIS — R410 Disorientation, unspecified: Secondary | ICD-10-CM

## 2018-06-27 DIAGNOSIS — R413 Other amnesia: Secondary | ICD-10-CM

## 2018-06-27 DIAGNOSIS — R419 Unspecified symptoms and signs involving cognitive functions and awareness: Secondary | ICD-10-CM

## 2018-07-04 NOTE — Telephone Encounter (Signed)
Novella Rob: [id number] (exp. 06/30/18 to 09/28/18) patient is scheduled at [facility] for 07/07/18.

## 2018-07-04 NOTE — Telephone Encounter (Signed)
Allison Miller with [name] called and informed me the patient wants to go to [facility] [facility] and they are going to cancel her appt with [facility]. I faxed order.

## 2018-07-04 NOTE — Procedures (Signed)
   HISTORY: 47 years old female, presented with memory loss.  TECHNIQUE:  This is a routine 16 channel EEG recording with one channel devoted to a limited EKG recording.  It was performed during wakefulness, drowsiness and asleep.  Hyperventilation and photic stimulation were performed as activating procedures.  There are minimum muscle and movement artifact noted.  Upon maximum arousal, posterior dominant waking rhythm consistent of her frequency of 9 hertz with an amplitude of 35 microvolts.  Activities are as symmetric over the bilateral posterior derivations and attenuated with eye opening.  Hyperventilation produced mild/moderate buildup with higher amplitude and the slower activities noted.  Photic stimulation did not alter the tracing.  During EEG recording, patient developed drowsiness and no deeper stage of sleep was recorded.  During EEG recording, there was no epileptiform discharge noted.  EKG demonstrate sinus rhythm, with heart rate of 72 bpm  CONCLUSION: This is a  normal awake and drowsy EEG.  There is no electrodiagnostic evidence of epileptiform discharge.  Marcial Pacas, M.D. Ph.D.  [facility] [place], [place] [address] Phone: [phone] Fax:      [phone]

## 2018-07-05 NOTE — Telephone Encounter (Signed)
I spoke to the patient and informed her that I did fax the MRI order to [facility] to the number that Anabel Halon gave me.

## 2018-07-05 NOTE — Telephone Encounter (Signed)
Pt is asking for a call back at [facility] for [facility] in Therapy) re: her upcoming MRI

## 2018-07-07 ENCOUNTER — Other Ambulatory Visit: Payer: Managed Care, Other (non HMO)

## 2018-07-18 ENCOUNTER — Telehealth: Payer: Self-pay | Admitting: Neurology

## 2018-07-18 NOTE — Telephone Encounter (Signed)
Pts requesting a call to discuss MRI results

## 2018-07-19 NOTE — Telephone Encounter (Signed)
I reviewed MRI report: "Normal pre- and postcontrast MRI of the brain."  -VRP

## 2018-07-19 NOTE — Telephone Encounter (Signed)
Spoke with Mrs. Wellbrock and she verbalized understanding her results. No questions or concerns at this time.

## 2018-07-19 NOTE — Telephone Encounter (Signed)
Spoke with Allison Miller and she stated that she had her MRI done on July 07, 2018 at [place]. I advised her that as soon as we get the results. That we would call her. She verbalized understanding.

## 2018-08-05 IMAGING — MG 2D DIGITAL DIAGNOSTIC BILATERAL MAMMOGRAM WITH CAD AND ADJUNCT T
8 of 12 series · 8 of 28 positions shown · non-contrast
Comparison: Previous exam(s).

CLINICAL DATA: Follow-up of probably benign cluster of cysts in the
right 1 o'clock breast.

EXAM:
2D DIGITAL DIAGNOSTIC BILATERAL MAMMOGRAM WITH CAD AND ADJUNCT TOMO
ULTRASOUND RIGHT BREAST

[R MLO synth-2D]
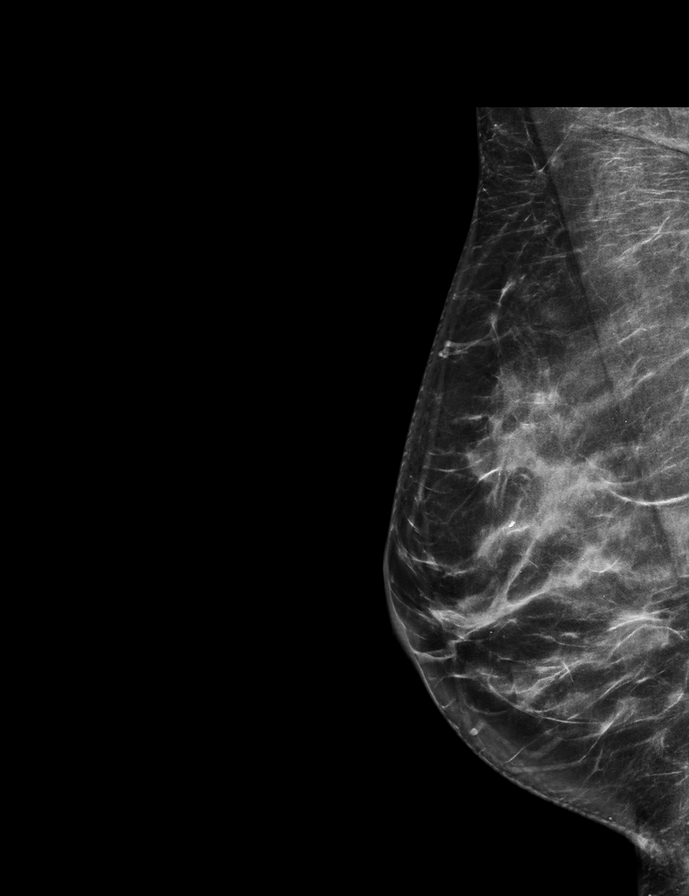

[L CC]
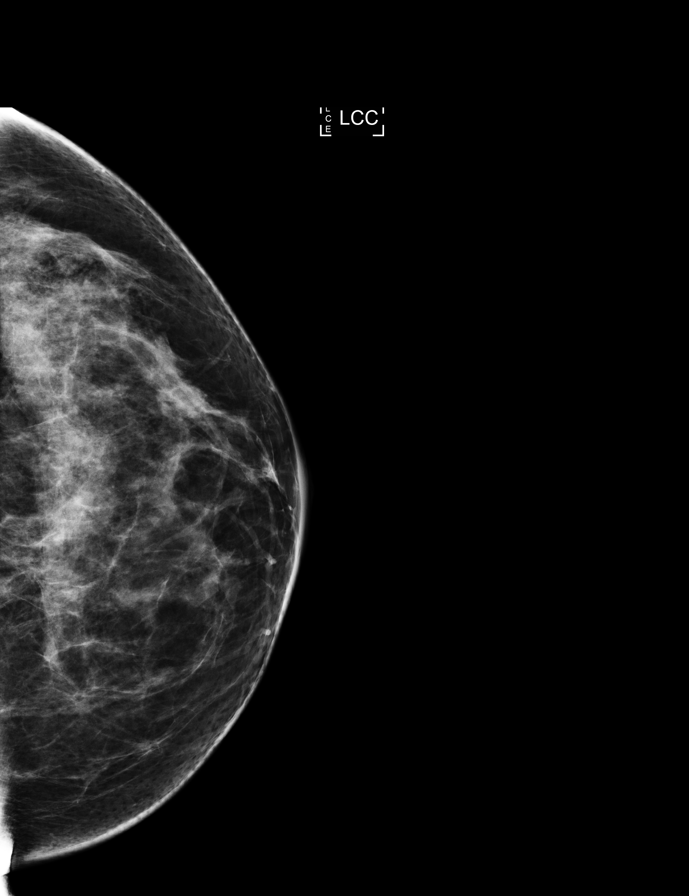

[R CC]
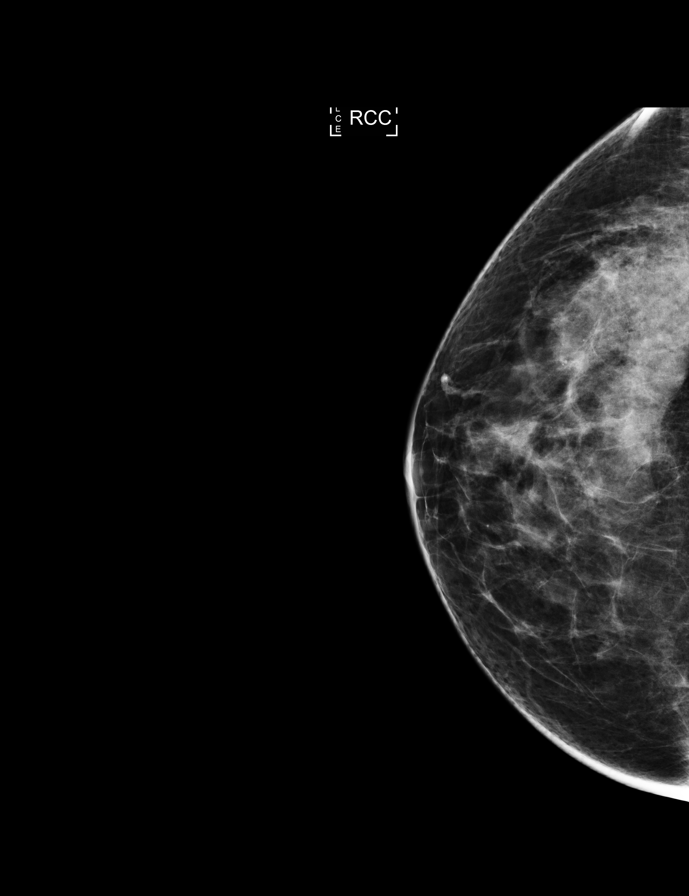

[L MLO]
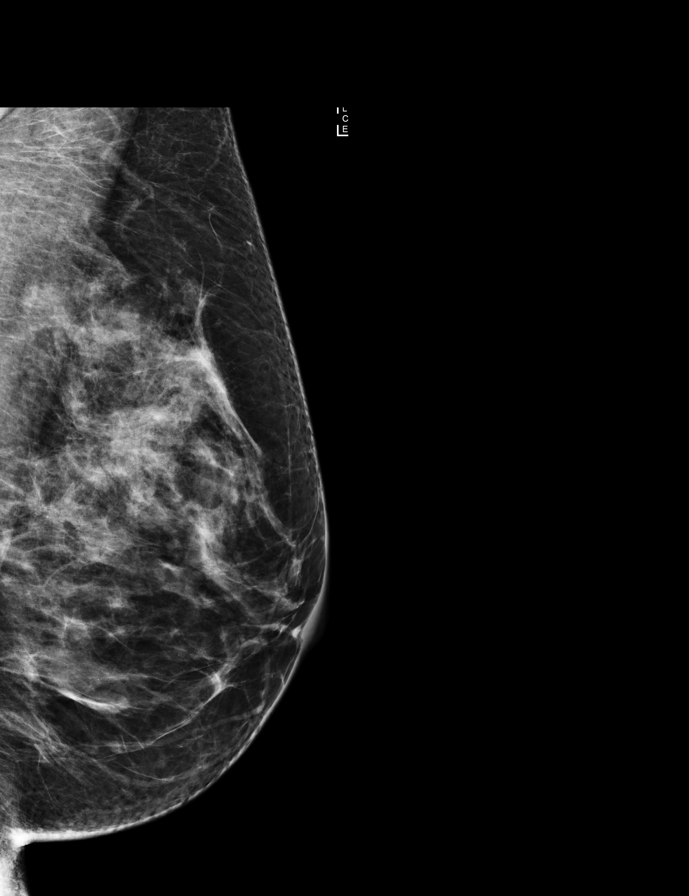

[R CC synth-2D]
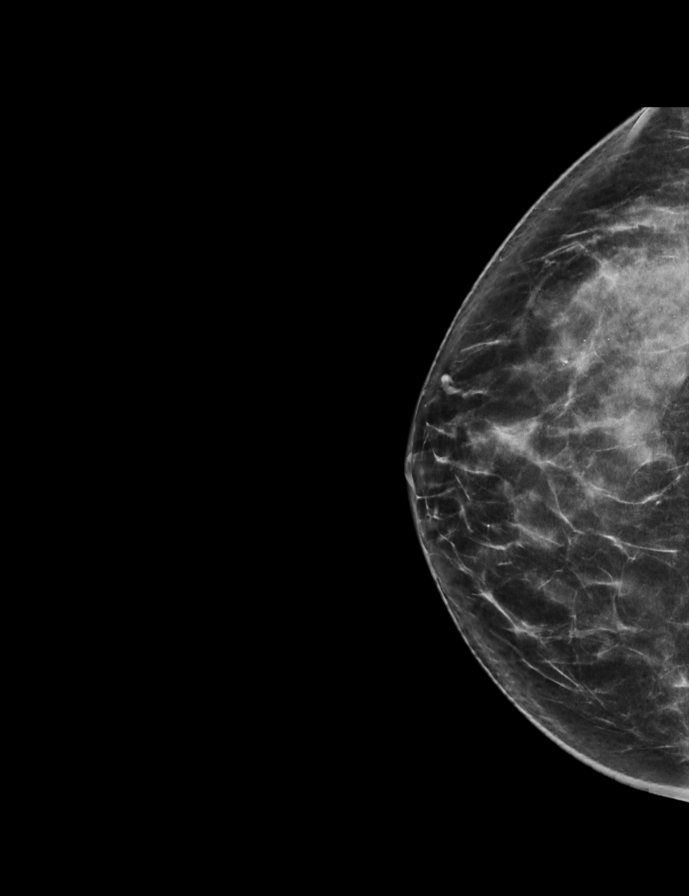

[L CC synth-2D]
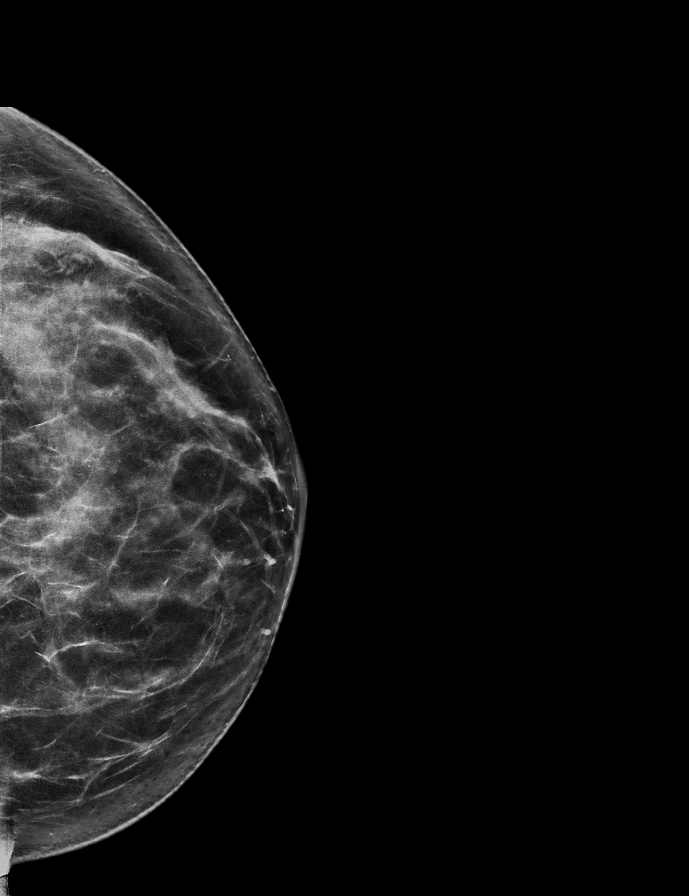

[R MLO]
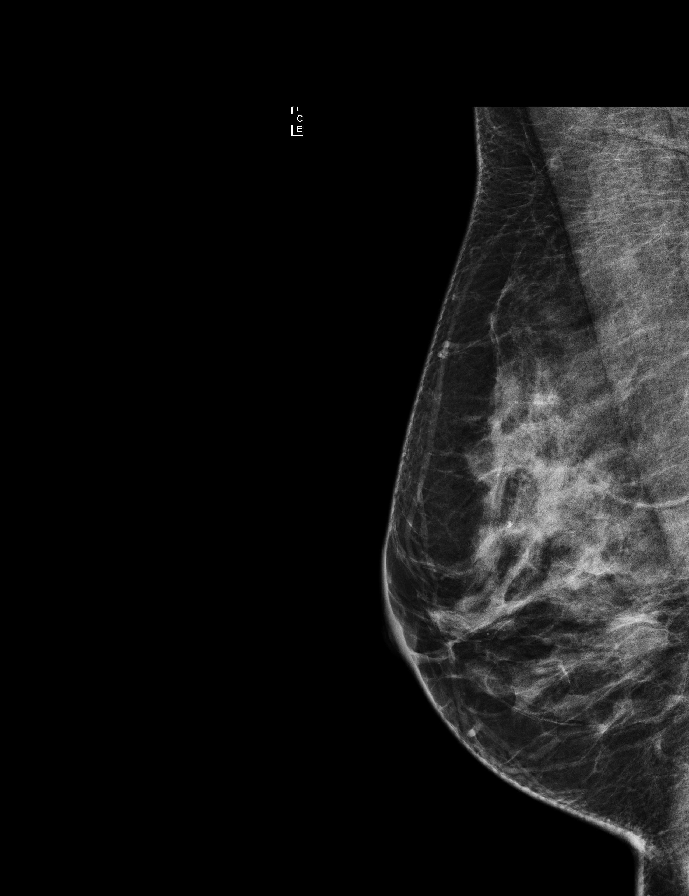

[L MLO synth-2D]
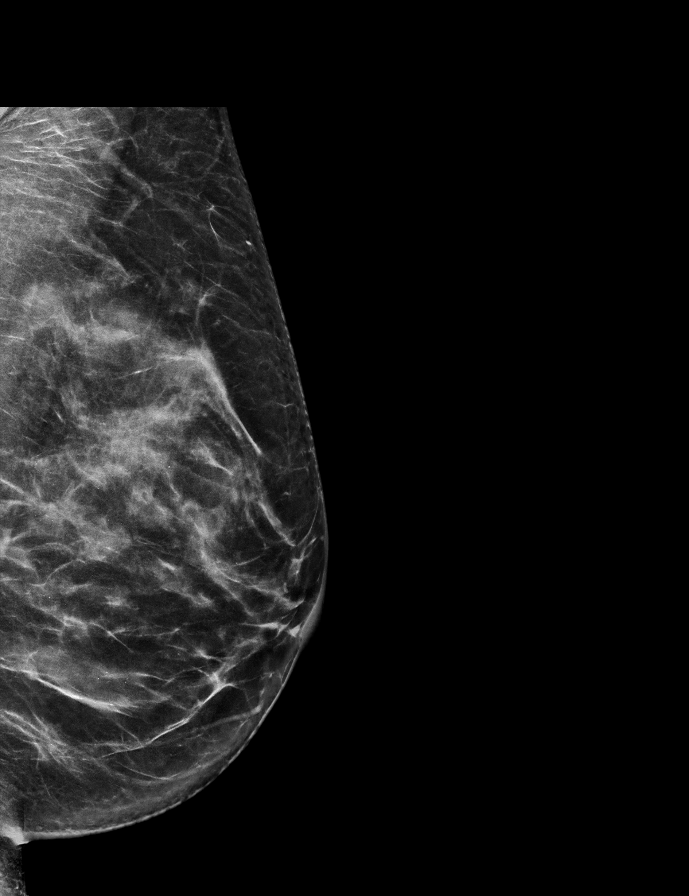

[8 of 28 positions shown; findings below may reference images not displayed]

ACR Breast Density Category c: The breast tissue is heterogeneously
dense, which may obscure small masses.
FINDINGS: Mammographically, there are no suspicious masses, areas of
architectural distortion or microcalcifications in either breast.
The previously seen non mass appearing asymmetry in the right breast
upper inner quadrant, posterior depth is less prominent.

Mammographic images were processed with CAD.

On physical exam, no suspicious masses are palpated.

Targeted ultrasound is performed, showing right breast 1 o'clock 2
cm from the nipple benign-appearing hypoechoic septated horizontally
oriented mass which measures 1.3 x 0.6 x 1.0 cm. This finding is
stable in appearance and size, accounting for differences in
measurement techniques, and has the appearance of probable cluster
of microcysts/fibrocystic changes.
IMPRESSION: Stable probably benign right breast 1 o'clock mass, for which
continued six-month follow-up is recommended.

RECOMMENDATION:
Diagnostic mammogram and possibly ultrasound of the right breast in
6 months. (Code:EF-0-00B)

I have discussed the findings and recommendations with the patient.
Results were also provided in writing at the conclusion of the
visit. If applicable, a reminder letter will be sent to the patient
regarding the next appointment.

BI-RADS CATEGORY  3: Probably benign.

## 2019-05-22 ENCOUNTER — Encounter: Payer: Self-pay | Admitting: Gynecology

## 2019-06-13 ENCOUNTER — Other Ambulatory Visit: Payer: Self-pay | Admitting: Obstetrics and Gynecology

## 2019-06-13 ENCOUNTER — Other Ambulatory Visit: Payer: Self-pay | Admitting: Physician Assistant

## 2019-06-13 DIAGNOSIS — Z1231 Encounter for screening mammogram for malignant neoplasm of breast: Secondary | ICD-10-CM

## 2019-07-06 ENCOUNTER — Ambulatory Visit
Admission: RE | Admit: 2019-07-06 | Discharge: 2019-07-06 | Disposition: A | Discharge status: Home / Self Care | Payer: 59 | Source: Ambulatory Visit | Attending: Obstetrics and Gynecology | Admitting: Obstetrics and Gynecology

## 2019-07-06 ENCOUNTER — Other Ambulatory Visit: Payer: Self-pay

## 2019-07-06 DIAGNOSIS — Z1231 Encounter for screening mammogram for malignant neoplasm of breast: Secondary | ICD-10-CM

## 2020-06-04 ENCOUNTER — Other Ambulatory Visit: Payer: Self-pay | Admitting: Obstetrics and Gynecology

## 2020-06-04 DIAGNOSIS — Z1231 Encounter for screening mammogram for malignant neoplasm of breast: Secondary | ICD-10-CM

## 2020-07-07 ENCOUNTER — Ambulatory Visit: Payer: 59

## 2020-07-16 ENCOUNTER — Ambulatory Visit: Payer: 59

## 2020-08-25 ENCOUNTER — Ambulatory Visit: Payer: 59

## 2020-10-01 ENCOUNTER — Ambulatory Visit
Admission: RE | Admit: 2020-10-01 | Discharge: 2020-10-01 | Disposition: A | Discharge status: Home / Self Care | Payer: No Typology Code available for payment source | Source: Ambulatory Visit | Attending: Obstetrics and Gynecology | Admitting: Obstetrics and Gynecology

## 2020-10-01 ENCOUNTER — Other Ambulatory Visit: Payer: Self-pay

## 2020-10-01 DIAGNOSIS — Z1231 Encounter for screening mammogram for malignant neoplasm of breast: Secondary | ICD-10-CM

## 2020-10-02 ENCOUNTER — Other Ambulatory Visit: Payer: Self-pay | Admitting: Obstetrics and Gynecology

## 2020-10-02 DIAGNOSIS — R928 Other abnormal and inconclusive findings on diagnostic imaging of breast: Secondary | ICD-10-CM

## 2020-10-17 ENCOUNTER — Ambulatory Visit
Admission: RE | Admit: 2020-10-17 | Discharge: 2020-10-17 | Disposition: A | Discharge status: Home / Self Care | Payer: No Typology Code available for payment source | Source: Ambulatory Visit | Attending: Obstetrics and Gynecology | Admitting: Obstetrics and Gynecology

## 2020-10-17 ENCOUNTER — Other Ambulatory Visit: Payer: Self-pay

## 2020-10-17 DIAGNOSIS — R928 Other abnormal and inconclusive findings on diagnostic imaging of breast: Secondary | ICD-10-CM

## 2021-06-18 ENCOUNTER — Encounter: Payer: Self-pay | Admitting: Physician Assistant

## 2021-06-18 ENCOUNTER — Ambulatory Visit (INDEPENDENT_AMBULATORY_CARE_PROVIDER_SITE_OTHER): Payer: No Typology Code available for payment source | Admitting: Physician Assistant

## 2021-06-18 VITALS — BP 120/74 | HR 70 | Ht 61.0 in | Wt 130.0 lb

## 2021-06-18 DIAGNOSIS — K5900 Constipation, unspecified: Secondary | ICD-10-CM

## 2021-06-18 DIAGNOSIS — K219 Gastro-esophageal reflux disease without esophagitis: Secondary | ICD-10-CM

## 2021-06-18 DIAGNOSIS — R1013 Epigastric pain: Secondary | ICD-10-CM | POA: Diagnosis not present

## 2021-06-18 DIAGNOSIS — Z1211 Encounter for screening for malignant neoplasm of colon: Secondary | ICD-10-CM | POA: Diagnosis not present

## 2021-06-18 NOTE — Patient Instructions (Signed)
If you are age 48 or younger, your body mass index should be between 19-25. Your Body mass index is 24.56 kg/m. If this is out of the aformentioned range listed, please consider follow up with your Primary Care Provider.   The [facility] providers would like to encourage you to use [facility] to communicate with providers for non-urgent requests or questions.  Due to long hold times on the telephone, sending your provider a message by [facility] may be faster and more efficient way to get a response. Please allow 48 business hours for a response.  Please remember that this is for non-urgent requests/questions.  PROCEDURES: You have been scheduled for an EGD and Colonoscopy. Please follow the written instructions given to you at your visit today. If you use inhalers (even only as needed), please bring them with you on the day of your procedure.  It was great seeing you today! Thank you for entrusting me with your care and choosing [facility].  Ellouise Newer, [place]

## 2021-06-18 NOTE — Progress Notes (Signed)
Chief Complaint: Upper abdominal pain, constipation and gas  HPI:    Allison Miller is a 50 year old Caucasian female with a past medical history of depression, reflux and others listed below, who was referred to me by Harrison Mons, PA for a complaint of upper abdominal pain, constipation and gas.    06/12/2021 patient seen by her primary care provider at [facility].  At that time discussed that her reflux was stable but not improved.  Anxiety was well controlled.  Was recommended she schedule her screening colonoscopy.    Today, the patient presents to clinic and discusses multiple GI complaints.  She has chronic constipation and a fear of going to the bathroom in public areas which she has somewhat gotten over, but has started Magnesium nightly to help with this which does seem to help her have a more normal stool.  Previous to this was going 4 to 5 days without a bowel movement.  Previously tried MiraLAX which did not help.    Most concerning to her is that she experienced a month of upper abdominal pain/feeling of a "knot" in her upper abdomen and early satiety only taking a bite of food and feeling full which started off noises and churning in her stomach.  She thought at first this may just be related to constipation because she was so full feeling, but had an x-ray with her PCP which was normal.  She was started on Sucralfate which helped but made her constipation worse so she stopped this medicine.  Over the past few weeks she has started to feel better.  Did start a fish oil supplement and sounds like a creatine supplement of some sort 2 weeks prior to symptoms.  She has since discontinued these.  Describes always battling with gas and bloating.    Describes a history of chronic reflux that seems to come and go for which she uses Omeprazole 40 mg as needed.      Description of H. pylori diagnosed in 1998 and thinks she may be had a couple other times.  Diagnosed via blood work.    Patient is now  85-90% vegan.  She has to sons in their 61s.  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 90 tablet 3   levonorgestrel (MIRENA) 20 MCG/24HR IUD 1 each by Intrauterine route once.     lisdexamfetamine (VYVANSE) 20 MG capsule 60 mg daily. 40 mg in the morning and 20 mg in the afternoon     omeprazole (PRILOSEC) 40 MG capsule Take 1 capsule (40 mg total) by mouth daily. (Patient taking differently: Take 40 mg by mouth daily as needed. ) 90 capsule 3   traZODone (DESYREL) 50 MG tablet TAKE 1-2 TABLETS (50-100 MG TOTAL) BY MOUTH AT BEDTIME AS NEEDED FOR SLEEP. 180 tablet 3   valACYclovir (VALTREX) 500 MG tablet TAKE ONE CAPLET BY MOUTH TWICE DAILY (Patient taking differently: Take 500 mg by mouth as needed. ) 180 tablet 2   No current facility-administered medications for this visit.  Allergies as of 06/18/2021 - Review Complete 06/20/2018  Allergen Reaction Noted   Trazodone and nefazodone Other (See Comments) 08/11/2015   Venlafaxine Other (See Comments) 10/12/2017    Family History  Problem Relation Age of Onset   Hypertension Mother    Hypertension Sister    Healthy Father    Stroke Maternal Grandfather    Stroke Paternal Grandfather    Irritable bowel syndrome Son    Brain cancer Brother    Dementia Paternal Grandmother     Social History   Socioeconomic History   Marital status: Single    Spouse name: n/a   Number of children: 2   Years of education: Master's   Highest education level: Not on file  Occupational History   Occupation: speech therapist  Tobacco Use   Smoking status: Never    Smokeless tobacco: Never  Substance and Sexual Activity   Alcohol use: Yes    Alcohol/week: 2.0 - 3.0 standard drinks    Types: 2 - 3 Cans of beer per week   Drug use: No   Sexual activity: Not Currently    Partners: Male    Birth control/protection: I.U.D.  Other Topics Concern   Not on file  Social History Narrative   Divorced. Lives alone every other week, when her sons are with their father.    Social Determinants of Health   Financial Resource Strain: Not on file  Food Insecurity: Not on file  Transportation Needs: Not on file  Physical Activity: Not on file  Stress: Not on file  Social Connections: Not on file  Intimate Partner Violence: Not on file    Review of Systems:    Constitutional: No weight loss, fever or chills Skin: No rash Cardiovascular: No chest pain  Respiratory: No SOB  Gastrointestinal: See HPI and otherwise negative Genitourinary: No dysuria  Neurological: No headache, dizziness or syncope Musculoskeletal: No new muscle or joint pain Hematologic: No bleeding or bruising Psychiatric: +anxiety   Physical Exam:  Vital signs: BP 120/74   Pulse 70   Ht 5\' 1"  (1.549 m)   Wt 130 lb (59 kg)   SpO2 98%   BMI 24.56 kg/m    Constitutional:   Pleasant Caucasian female appears to be in NAD, Well developed, Well nourished, alert and cooperative Head:  Normocephalic and atraumatic. Eyes:   PEERL, EOMI. No icterus. Conjunctiva pink. Ears:  Normal auditory acuity. Neck:  Supple Throat: Oral cavity and pharynx without inflammation, swelling or lesion.  Respiratory: Respirations even and unlabored. Lungs clear to auscultation bilaterally.   No wheezes, crackles, or rhonchi.  Cardiovascular: Normal S1, S2. No MRG. Regular rate and rhythm. No peripheral edema, cyanosis or pallor.  Gastrointestinal:  Soft, nondistended, nontender. No rebound or guarding. Normal bowel sounds. No appreciable masses or hepatomegaly. Rectal:  Not performed.  Msk:  Symmetrical  without gross deformities. Without edema, no deformity or joint abnormality.  Neurologic:  Alert and  oriented x4;  grossly normal neurologically.  Skin:   Dry and intact without significant lesions or rashes. Psychiatric: Demonstrates good judgement and reason without abnormal affect or behaviors.  No recent labs/imaging.  Assessment: 1.  Chronic constipation: Since she was little, now better with a Magnesium supplement at night 2.  GERD: Intermittent symptoms likely related to diet 3.  Epigastric pain: Possibly gastritis with above versus H. pylori 4.  Screening for colorectal cancer: patient is 27 never had a screening colonoscopy  Plan: 1.  Scheduled patient for a screening colonoscopy and  diagnostic EGD in the [facility] with Dr. Havery Moros.  Did provide the patient with a detailed list of risks for the procedures and she agrees to proceed. Patient is appropriate for endoscopic procedure(s) in the ambulatory ([place]) setting.  2.  Asked the patient to hold her Omeprazole which she only uses as needed at least 2 weeks prior to time procedure so we can test for H. pylori. 3.  Patient will have a 2-day bowel prep given history of chronic constipation 4.  Patient to follow in clinic per recommendations from Dr. Havery Moros after time of procedure.  She will follow with him in the future as her primary GI physician.  Allison Newer, PA-C [facility] 06/18/2021, 9:59 AM  Cc: Harrison Mons, PA

## 2021-06-18 NOTE — Progress Notes (Signed)
Agree with assessment and plan as outlined.  

## 2021-07-09 ENCOUNTER — Encounter: Payer: Self-pay | Admitting: Gastroenterology

## 2021-07-13 ENCOUNTER — Telehealth: Payer: Self-pay | Admitting: Gastroenterology

## 2021-07-13 NOTE — Telephone Encounter (Signed)
Attempted to reach patient, her phone goes straight to vm. Lm on vm for patient to return call.

## 2021-07-13 NOTE — Telephone Encounter (Signed)
Patient called has a colonoscopy scheduled for W[place] 07/15/21 and she is wanting to go over a few things regarding her prep instructions and also having low blood pressure. Requested a call back. To discuss further.

## 2021-07-14 NOTE — Telephone Encounter (Signed)
Called and spoke with patient, she wanted to make sure she was supposed to get 8 dulcolax tablets. Advised that is correct because she is doing a 2-day prep. Pt wondered if she could use preparation H due to the prep, advised that this is OK. Pt also states that her BP tends to run low and she is concerned about the anesthesia. Advised that they will check her vitals before they take her back and if they feel like it is not safe to proceed they will let her know. Patient verbalized understanding and had no concerns at the end of the call.

## 2021-07-15 ENCOUNTER — Other Ambulatory Visit: Payer: Self-pay

## 2021-07-15 ENCOUNTER — Ambulatory Visit (AMBULATORY_SURGERY_CENTER): Payer: No Typology Code available for payment source | Admitting: Gastroenterology

## 2021-07-15 ENCOUNTER — Encounter: Payer: Self-pay | Admitting: Gastroenterology

## 2021-07-15 VITALS — BP 119/72 | HR 79 | Temp 98.7°F | Resp 12 | Ht 61.0 in | Wt 130.0 lb

## 2021-07-15 DIAGNOSIS — K449 Diaphragmatic hernia without obstruction or gangrene: Secondary | ICD-10-CM | POA: Diagnosis not present

## 2021-07-15 DIAGNOSIS — K219 Gastro-esophageal reflux disease without esophagitis: Secondary | ICD-10-CM | POA: Diagnosis not present

## 2021-07-15 DIAGNOSIS — R1013 Epigastric pain: Secondary | ICD-10-CM

## 2021-07-15 DIAGNOSIS — Z1211 Encounter for screening for malignant neoplasm of colon: Secondary | ICD-10-CM | POA: Diagnosis not present

## 2021-07-15 MED ORDER — SODIUM CHLORIDE 0.9 % IV SOLN: 500.0000 mL | Freq: Once | INTRAVENOUS | Status: AC

## 2021-07-15 NOTE — Patient Instructions (Signed)
YOU HAD AN ENDOSCOPIC PROCEDURE TODAY AT [facility]:   Refer to the procedure report that was given to you for any specific questions about what was found during the examination.  If the procedure report does not answer your questions, please call your gastroenterologist to clarify.  If you requested that your care partner not be given the details of your procedure findings, then the procedure report has been included in a sealed envelope for you to review at your convenience later.  YOU SHOULD EXPECT: Some feelings of bloating in the abdomen. Passage of more gas than usual.  Walking can help get rid of the air that was put into your GI tract during the procedure and reduce the bloating. If you had a lower endoscopy (such as a colonoscopy or flexible sigmoidoscopy) you may notice spotting of blood in your stool or on the toilet paper. If you underwent a bowel prep for your procedure, you may not have a normal bowel movement for a few days.  Please Note:  You might notice some irritation and congestion in your nose or some drainage.  This is from the oxygen used during your procedure.  There is no need for concern and it should clear up in a day or so.  SYMPTOMS TO REPORT IMMEDIATELY:  Following lower endoscopy (colonoscopy or flexible sigmoidoscopy):  Excessive amounts of blood in the stool  Significant tenderness or worsening of abdominal pains  Swelling of the abdomen that is new, acute  Fever of 100F or higher  Following upper endoscopy (EGD)  Vomiting of blood or coffee ground material  New chest pain or pain under the shoulder blades  Painful or persistently difficult swallowing  New shortness of breath  Fever of 100F or higher  Black, tarry-looking stools  For urgent or emergent issues, a gastroenterologist can be reached at any hour by calling [phone]. Do not use MyChart messaging for urgent concerns.    DIET:  We do recommend a small meal at first, but  then you may proceed to your regular diet.  Drink plenty of fluids but you should avoid alcoholic beverages for 24 hours.  MEDICATIONS: Continue present medications.  Please see handouts given to you by your recovery nurse.  Thank you for allowing [place] to provide for your healthcare needs today.  ACTIVITY:  You should plan to take it easy for the rest of today and you should NOT DRIVE or use heavy machinery until tomorrow (because of the sedation medicines used during the test).    FOLLOW UP: Our staff will call the number listed on your records 48-72 hours following your procedure to check on you and address any questions or concerns that you may have regarding the information given to you following your procedure. If we do not reach you, we will leave a message.  We will attempt to reach you two times.  During this call, we will ask if you have developed any symptoms of COVID 19. If you develop any symptoms (ie: fever, flu-like symptoms, shortness of breath, cough etc.) before then, please call [phone].  If you test positive for Covid 19 in the 2 weeks post procedure, please call and report this information to [place].    If any biopsies were taken you will be contacted by phone or by letter within the next 1-3 weeks.  Please call us at [phone] if you have not heard about the biopsies in 3 weeks.    SIGNATURES/CONFIDENTIALITY: You and/or your  care partner have signed paperwork which will be entered into your electronic medical record.  These signatures attest to the fact that that the information above on your After Visit Summary has been reviewed and is understood.  Full responsibility of the confidentiality of this discharge information lies with you and/or your care-partner.

## 2021-07-15 NOTE — Progress Notes (Signed)
VS completed by CW.  Medical history reviewed and updated.

## 2021-07-15 NOTE — Progress Notes (Signed)
Called to room to assist during endoscopic procedure.  Patient ID and intended procedure confirmed with present staff. Received instructions for my participation in the procedure from the performing physician.  

## 2021-07-15 NOTE — Progress Notes (Signed)
Pt Drowsy. VSS. To PACU, report to RN. No anesthetic complications noted.  

## 2021-07-15 NOTE — Op Note (Signed)
[place] Patient Name: Allison Miller Procedure Date: 07/15/2021 1:18 PM MRN: [id number] Endoscopist: Remo Lipps P. Havery Moros , MD Age: 50 Referring MD:  Date of Birth: September 26, 1970 Gender: Female Account #: [id number] Procedure:                Colonoscopy Indications:              Screening for colorectal malignant neoplasm, This                            is the patient's first colonoscopy Medicines:                Monitored Anesthesia Care Procedure:                Pre-Anesthesia Assessment:                           - Prior to the procedure, a History and Physical                            was performed, and patient medications and                            allergies were reviewed. The patient's tolerance of                            previous anesthesia was also reviewed. The risks                            and benefits of the procedure and the sedation                            options and risks were discussed with the patient.                            All questions were answered, and informed consent                            was obtained. Prior Anticoagulants: The patient has                            taken no previous anticoagulant or antiplatelet                            agents. ASA Grade Assessment: II - A patient with                            mild systemic disease. After reviewing the risks                            and benefits, the patient was deemed in                            satisfactory condition to undergo the procedure.  After obtaining informed consent, the colonoscope                            was passed under direct vision. Throughout the                            procedure, the patient's blood pressure, pulse, and                            oxygen saturations were monitored continuously. The                            [id number]-HQ190L Colonoscope was introduced through the                            anus and advanced  to the the cecum, identified by                            appendiceal orifice and ileocecal valve. The                            colonoscopy was performed without difficulty. The                            patient tolerated the procedure well. The quality                            of the bowel preparation was good. The ileocecal                            valve, appendiceal orifice, and rectum were                            photographed. Scope In: 1:51:28 PM Scope Out: 2:09:33 PM Scope Withdrawal Time: 0 hours 12 minutes 2 seconds  Total Procedure Duration: 0 hours 18 minutes 5 seconds  Findings:                 Skin tags were found on perianal exam.                           A few small-mouthed diverticula were found in the                            sigmoid colon.                           The colon was quite tortuous.                           The exam was otherwise without abnormality. Complications:            No immediate complications. Estimated blood loss:                            None. Estimated Blood Loss:  Estimated blood loss: none. Impression:               - Perianal skin tags found on perianal exam.                           - Diverticulosis in the sigmoid colon.                           - Tortuous colon.                           - The examination was otherwise normal.                           - No polyps Recommendation:           - Patient has a contact number available for                            emergencies. The signs and symptoms of potential                            delayed complications were discussed with the                            patient. Return to normal activities tomorrow.                            Written discharge instructions were provided to the                            patient.                           - Resume previous diet.                           - Continue present medications.                           - Repeat colonoscopy  in 10 years for screening                            purposes. Remo Lipps P. Jnaya Butrick, MD 07/15/2021 2:12:26 PM This report has been signed electronically.

## 2021-07-15 NOTE — Op Note (Signed)
[facility] Patient Name: Allison Miller Procedure Date: 07/15/2021 1:36 PM MRN: [id number] Endoscopist: Remo Lipps P. Havery Moros , MD Age: 50 Referring MD:  Date of Birth: 02/06/1971 Gender: Female Account #: [id number] Procedure:                Upper GI endoscopy Indications:              Epigastric abdominal pain, history of                            gastro-esophageal reflux disease on omeprazole -                            remote history of H pylori. Abdominal discomfort                            much better since clinic visit. Medicines:                Monitored Anesthesia Care Procedure:                Pre-Anesthesia Assessment:                           - Prior to the procedure, a History and Physical                            was performed, and patient medications and                            allergies were reviewed. The patient's tolerance of                            previous anesthesia was also reviewed. The risks                            and benefits of the procedure and the sedation                            options and risks were discussed with the patient.                            All questions were answered, and informed consent                            was obtained. Prior Anticoagulants: The patient has                            taken no previous anticoagulant or antiplatelet                            agents. ASA Grade Assessment: II - A patient with                            mild systemic disease. After reviewing the risks  and benefits, the patient was deemed in                            satisfactory condition to undergo the procedure.                           After obtaining informed consent, the endoscope was                            passed under direct vision. Throughout the                            procedure, the patient's blood pressure, pulse, and                            oxygen saturations were  monitored continuously. The                            Endoscope was introduced through the mouth, and                            advanced to the second part of duodenum. The upper                            GI endoscopy was accomplished without difficulty.                            The patient tolerated the procedure well. Scope In: Scope Out: Findings:                 Esophagogastric landmarks were identified: the                            Z-line was found at 36 cm, the gastroesophageal                            junction was found at 36 cm and the upper extent of                            the gastric folds was found at 37 cm from the                            incisors.                           A 1 cm hiatal hernia was present.                           The exam of the esophagus was otherwise normal.                           Patchy mildly erythematous mucosa was found in the  gastric antrum.                           The exam of the stomach was otherwise normal.                           Biopsies were taken with a cold forceps in the                            gastric body, at the incisura and in the gastric                            antrum for Helicobacter pylori testing.                           The duodenal bulb and second portion of the                            duodenum were normal. Complications:            No immediate complications. Estimated blood loss:                            Minimal. Estimated Blood Loss:     Estimated blood loss was minimal. Impression:               - Esophagogastric landmarks identified.                           - 1 cm hiatal hernia.                           - Normal esophagus otherwise                           - Erythematous mucosa in the antrum.                           - Normal stomach otherwise - biopsies taken to rule                            out H pylori                           - Normal duodenal bulb  and second portion of the                            duodenum. Recommendation:           - Patient has a contact number available for                            emergencies. The signs and symptoms of potential                            delayed complications were discussed with the  patient. Return to normal activities tomorrow.                            Written discharge instructions were provided to the                            patient.                           - Resume previous diet.                           - Continue present medications.                           - Await pathology results. Remo Lipps P. Channel Papandrea, MD 07/15/2021 2:15:57 PM This report has been signed electronically.

## 2021-07-15 NOTE — Progress Notes (Signed)
[facility] History and Physical   Primary Care Physician:  Harrison Mons, PA   Reason for Procedure:   GERD, epigastric pain / history of H pylori, colon cancer screening  Plan:    EGD and colonoscopy     HPI: Allison Miller is a 50 y.o. female  here for colonoscopy screening - first time exam. Patient has ongoing constipation. Otherwise has a history of longstanding GERD with recent epigastric pain despite omeprazole. EGD to further evaluate, she is doing better since the clinic visit. No family history of colon cancer known. Otherwise feels well without any cardiopulmonary symptoms.    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 Authorizing Provider  busPIRone (BUSPAR) 7.5 MG tablet Take 7.5 mg by mouth daily at 2 am.   Yes [provider]  citalopram (CELEXA) 40 MG tablet Take 1 tablet (40 mg total) by mouth daily. Patient taking differently: Take 10 mg by mouth daily. 05/10/17  Yes Harrison Mons, PA  methylphenidate 27 MG PO CR tablet Take 27 mg by mouth every morning.   Yes [provider]  traZODone (DESYREL) 50 MG tablet TAKE 1-2 TABLETS (50-100 MG TOTAL) BY MOUTH AT BEDTIME AS NEEDED FOR SLEEP. 01/17/18  Yes Harrison Mons, PA  levonorgestrel (MIRENA) 20 MCG/24HR IUD 1 each by Intrauterine route once.    [provider]  omeprazole (PRILOSEC) 40 MG capsule Take 1 capsule (40 mg total) by mouth daily. Patient taking differently: Take 40 mg by mouth daily as needed. 04/13/16   Harrison Mons, PA  valACYclovir (VALTREX) 500 MG tablet TAKE ONE CAPLET BY MOUTH TWICE DAILY Patient taking  differently: Take 500 mg by mouth as needed. 11/07/15   Harrison Mons, PA    Current Outpatient Medications  Medication Sig Dispense Refill   busPIRone (BUSPAR) 7.5 MG tablet Take 7.5 mg by mouth daily at 2 am.     citalopram (CELEXA) 40 MG tablet Take 1 tablet (40 mg total) by mouth daily. (Patient taking differently: Take 10 mg by mouth daily.) 90 tablet 3   methylphenidate 27 MG PO CR tablet Take 27 mg by mouth every morning.     traZODone (DESYREL) 50 MG tablet TAKE 1-2 TABLETS (50-100 MG TOTAL) BY MOUTH AT BEDTIME AS NEEDED FOR SLEEP. 180 tablet 3   levonorgestrel (MIRENA) 20 MCG/24HR IUD 1 each by Intrauterine route once.     omeprazole (PRILOSEC) 40 MG capsule Take 1 capsule (40 mg total) by mouth daily. (Patient taking differently: Take 40 mg by mouth daily as needed.) 90 capsule 3   valACYclovir (VALTREX) 500 MG tablet TAKE ONE CAPLET BY MOUTH TWICE DAILY (Patient taking differently: Take 500 mg by mouth as needed.) 180 tablet 2   Current Facility-Administered Medications  Medication Dose Route Frequency Provider Last Rate Last Admin   0.9 %  sodium chloride infusion  500 mL Intravenous Once Finleigh Cheong, Carlota Raspberry, MD        Allergies as of 07/15/2021 - Review Complete 07/15/2021  Allergen Reaction Noted   Trazodone and nefazodone Other (  See Comments) 08/11/2015   Venlafaxine Other (See Comments) 10/12/2017    Family History  Problem Relation Age of Onset   Hypertension Mother    Healthy Father    Hypertension Sister    Brain cancer Brother    Stroke Maternal Grandfather    Dementia Paternal Grandmother    Stroke Paternal Grandfather    Irritable bowel syndrome Son    Colon cancer Neg Hx    Rectal cancer Neg Hx    Stomach cancer Neg Hx     Social History   Socioeconomic History   Marital status: Single    Spouse name: n/a   Number of children: 2   Years of education: Master's   Highest education level: Not on file  Occupational History   Occupation: speech  therapist  Tobacco Use   Smoking status: Never   Smokeless tobacco: Never  Vaping Use   Vaping Use: Never used  Substance and Sexual Activity   Alcohol use: Yes    Alcohol/week: 2.0 - 3.0 standard drinks    Types: 2 - 3 Cans of beer per week   Drug use: No   Sexual activity: Not Currently    Partners: Male    Birth control/protection: I.U.D.  Other Topics Concern   Not on file  Social History Narrative   Divorced. Lives alone every other week, when her sons are with their father.    Social Determinants of Health   Financial Resource Strain: Not on file  Food Insecurity: Not on file  Transportation Needs: Not on file  Physical Activity: Not on file  Stress: Not on file  Social Connections: Not on file  Intimate Partner Violence: Not on file    Review of Systems: All other review of systems negative except as mentioned in the HPI.  Physical Exam: Vital signs BP 112/84   Pulse (!) 102   Temp 98.7 F (37.1 C) (Temporal)   Ht 5\' 1"  (1.549 m)   Wt 130 lb (59 kg)   SpO2 97%   BMI 24.56 kg/m   General:   Alert,  Well-developed, pleasant and cooperative in NAD Lungs:  Clear throughout to auscultation.   Heart:  Regular rate and rhythm Abdomen:  Soft, nontender and nondistended.   Neuro/Psych:  Alert and cooperative. Normal mood and affect. A and O x 3  Jolly Mango, MD [facility]

## 2021-07-17 ENCOUNTER — Telehealth: Payer: Self-pay

## 2021-07-17 NOTE — Telephone Encounter (Signed)
Error

## 2021-07-17 NOTE — Telephone Encounter (Signed)
Left message on answering machine. 

## 2021-09-21 ENCOUNTER — Other Ambulatory Visit: Payer: Self-pay | Admitting: Obstetrics and Gynecology

## 2021-09-21 DIAGNOSIS — Z1231 Encounter for screening mammogram for malignant neoplasm of breast: Secondary | ICD-10-CM

## 2021-10-02 ENCOUNTER — Ambulatory Visit
Admission: RE | Admit: 2021-10-02 | Discharge: 2021-10-02 | Disposition: A | Discharge status: Home / Self Care | Payer: No Typology Code available for payment source | Source: Ambulatory Visit | Attending: Obstetrics and Gynecology | Admitting: Obstetrics and Gynecology

## 2021-10-02 DIAGNOSIS — Z1231 Encounter for screening mammogram for malignant neoplasm of breast: Secondary | ICD-10-CM

## 2022-03-14 IMAGING — MG MM DIGITAL DIAGNOSTIC UNILAT*R* W/ TOMO W/ CAD
4 series · 4 of 12 positions shown · non-contrast
Comparison: Previous exam(s).

CLINICAL DATA: Screening recall for a possible right breast
asymmetry.

EXAM:
DIGITAL DIAGNOSTIC UNILATERAL RIGHT MAMMOGRAM WITH TOMOSYNTHESIS AND
CAD; ULTRASOUND RIGHT BREAST LIMITED
TECHNIQUE: Right digital diagnostic mammography and breast tomosynthesis was
performed. The images were evaluated with computer-aided detection.;
Targeted ultrasound examination of the right breast was performed

[R ML synth-2D]
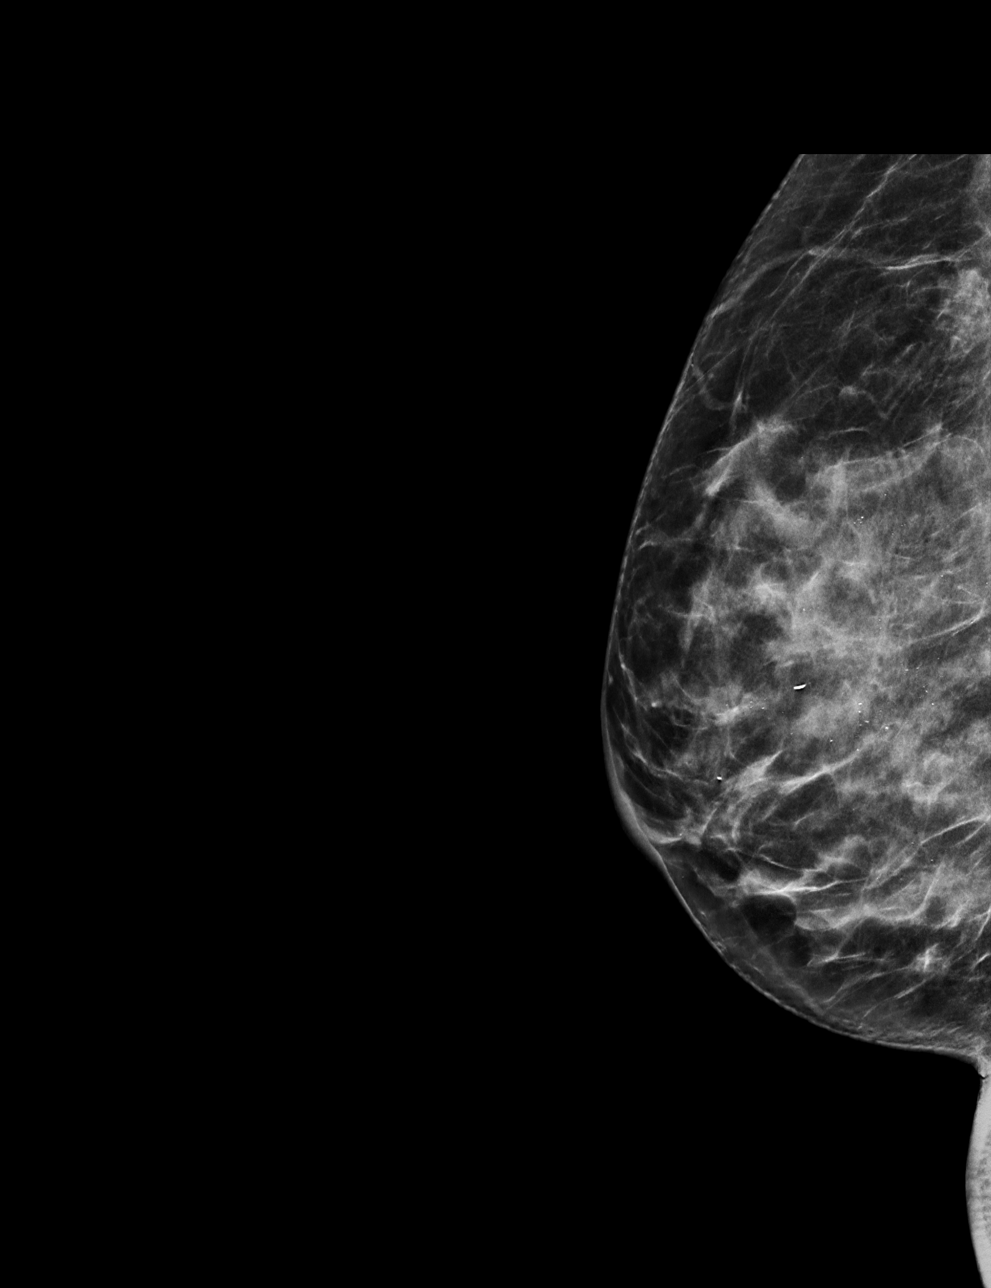

[R CC synth-2D]
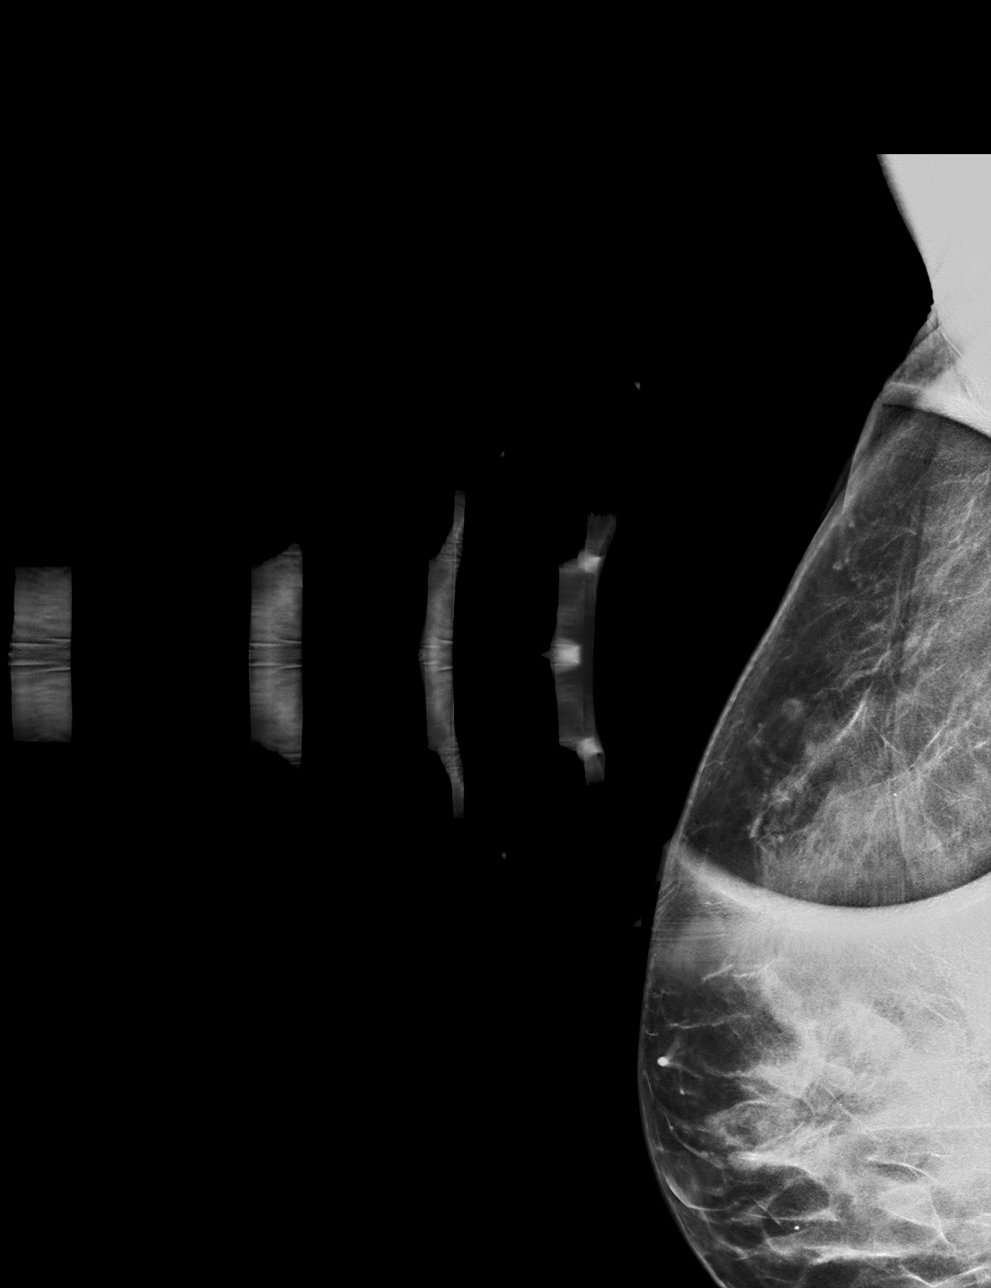

[R ML tomo · tomo slice 29/58.0]
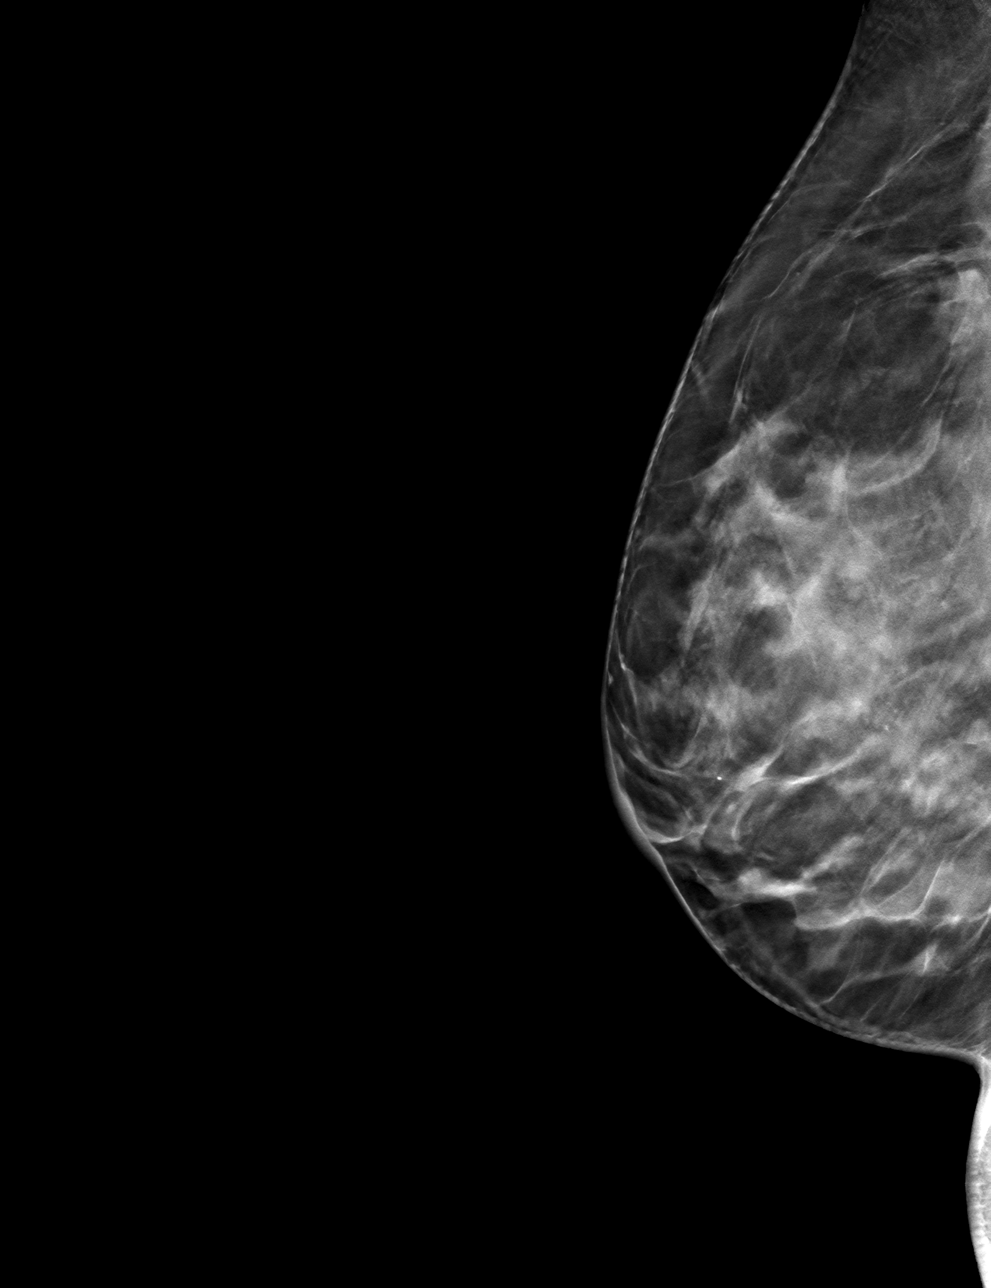

[R CC tomo · tomo slice 31/60.0]
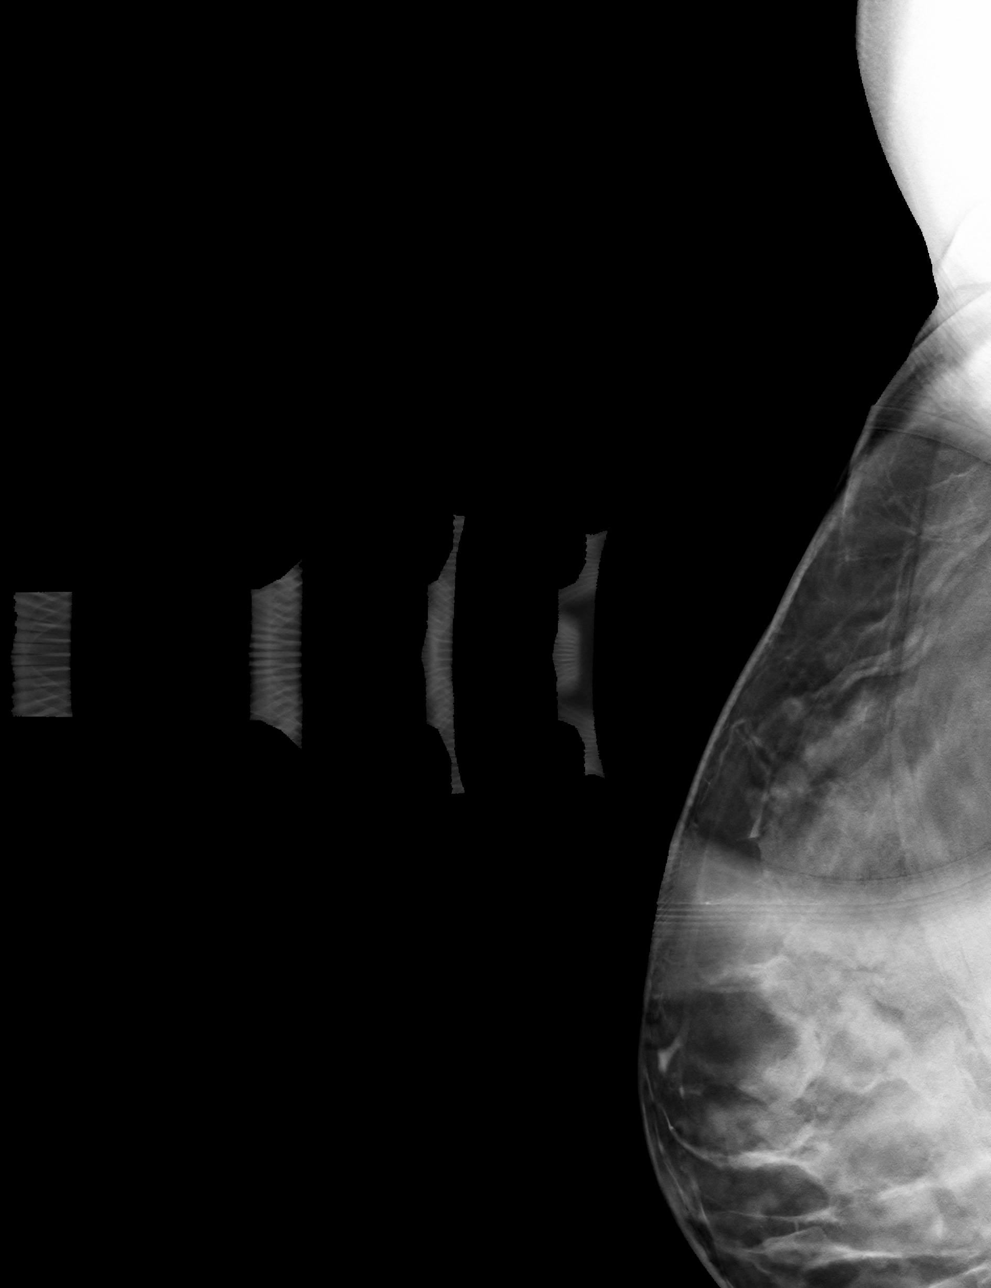

[4 of 12 positions shown; findings below may reference images not displayed]

ACR Breast Density Category c: The breast tissue is heterogeneously
dense, which may obscure small masses.
FINDINGS: The asymmetry, noted in the far lateral, posterior aspect of the
right breast on the current screening cc view, disperses with spot
compression imaging consistent with normal fibroglandular tissue.
There is no defined mass. On the mL view, there is an island of
fibroglandular tissue in the superior, posterior breast. This is
similar to prior studies. There are no areas of architectural
distortion and no suspicious calcifications.

On physical exam, no mass is palpated in the far lateral right
breast.

Targeted ultrasound is performed, showing a cyst in the right breast
at 10 o'clock, 6 cm the nipple, posterior depth, measuring 8 x 5 x 7
mm. There are no solid masses or suspicious lesions.
IMPRESSION: 1. No evidence of breast malignancy.
2. The area of asymmetry noted on the current screening study was
due to a combination of fibroglandular tissue and an 8 mm simple
cyst.

RECOMMENDATION:
Screening mammogram in one year.(Code:67-[id number])

I have discussed the findings and recommendations with the patient.
If applicable, a reminder letter will be sent to the patient
regarding the next appointment.

BI-RADS CATEGORY  2: Benign.

## 2022-09-29 ENCOUNTER — Other Ambulatory Visit: Payer: Self-pay | Admitting: Obstetrics and Gynecology

## 2022-09-29 DIAGNOSIS — Z1231 Encounter for screening mammogram for malignant neoplasm of breast: Secondary | ICD-10-CM

## 2022-11-08 ENCOUNTER — Ambulatory Visit
Admission: RE | Admit: 2022-11-08 | Discharge: 2022-11-08 | Disposition: A | Discharge status: Home / Self Care | Payer: No Typology Code available for payment source | Source: Ambulatory Visit | Attending: Obstetrics and Gynecology | Admitting: Obstetrics and Gynecology

## 2022-11-08 DIAGNOSIS — Z1231 Encounter for screening mammogram for malignant neoplasm of breast: Secondary | ICD-10-CM

## 2024-01-20 ENCOUNTER — Other Ambulatory Visit: Payer: Self-pay | Admitting: Obstetrics and Gynecology

## 2024-01-20 DIAGNOSIS — Z1231 Encounter for screening mammogram for malignant neoplasm of breast: Secondary | ICD-10-CM

## 2024-02-03 ENCOUNTER — Ambulatory Visit
Admission: RE | Admit: 2024-02-03 | Discharge: 2024-02-03 | Disposition: A | Discharge status: Home / Self Care | Source: Ambulatory Visit | Attending: Obstetrics and Gynecology | Admitting: Obstetrics and Gynecology

## 2024-02-03 DIAGNOSIS — Z1231 Encounter for screening mammogram for malignant neoplasm of breast: Secondary | ICD-10-CM

## 2024-02-09 ENCOUNTER — Other Ambulatory Visit: Payer: Self-pay | Admitting: Obstetrics and Gynecology

## 2024-02-09 DIAGNOSIS — R928 Other abnormal and inconclusive findings on diagnostic imaging of breast: Secondary | ICD-10-CM

## 2024-02-17 ENCOUNTER — Ambulatory Visit
Admission: RE | Admit: 2024-02-17 | Discharge: 2024-02-17 | Disposition: A | Discharge status: Home / Self Care | Source: Ambulatory Visit | Attending: Obstetrics and Gynecology | Admitting: Obstetrics and Gynecology

## 2024-02-17 DIAGNOSIS — R928 Other abnormal and inconclusive findings on diagnostic imaging of breast: Secondary | ICD-10-CM
